# Patient Record
Sex: Female | Born: 1990 | Race: Black or African American | Hispanic: No | Marital: Single | Smoking: Never smoker
Health system: Southern US, Community
[De-identification: ages and names within clinical notes are randomized; demographics above are authoritative.]

## PROBLEM LIST (undated history)

## (undated) DIAGNOSIS — F509 Eating disorder, unspecified: Secondary | ICD-10-CM

## (undated) DIAGNOSIS — T7840XA Allergy, unspecified, initial encounter: Secondary | ICD-10-CM

## (undated) DIAGNOSIS — F32A Depression, unspecified: Secondary | ICD-10-CM

## (undated) DIAGNOSIS — J45909 Unspecified asthma, uncomplicated: Secondary | ICD-10-CM

## (undated) DIAGNOSIS — F329 Major depressive disorder, single episode, unspecified: Secondary | ICD-10-CM

## (undated) DIAGNOSIS — F419 Anxiety disorder, unspecified: Secondary | ICD-10-CM

## (undated) DIAGNOSIS — N946 Dysmenorrhea, unspecified: Secondary | ICD-10-CM

## (undated) HISTORY — DX: Dysmenorrhea, unspecified: N94.6

## (undated) HISTORY — DX: Allergy, unspecified, initial encounter: T78.40XA

## (undated) HISTORY — DX: Eating disorder, unspecified: F50.9

## (undated) HISTORY — PX: TONSILLECTOMY AND ADENOIDECTOMY: SUR1326

## (undated) HISTORY — DX: Anxiety disorder, unspecified: F41.9

## (undated) HISTORY — DX: Major depressive disorder, single episode, unspecified: F32.9

## (undated) HISTORY — DX: Depression, unspecified: F32.A

## (undated) HISTORY — DX: Unspecified asthma, uncomplicated: J45.909

---
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
## 2013-09-25 ENCOUNTER — Encounter: Payer: Self-pay | Admitting: *Deleted

## 2013-09-26 ENCOUNTER — Ambulatory Visit (INDEPENDENT_AMBULATORY_CARE_PROVIDER_SITE_OTHER): Payer: BC Managed Care – PPO | Admitting: Family Medicine

## 2013-09-26 ENCOUNTER — Encounter: Payer: Self-pay | Admitting: Family Medicine

## 2013-09-26 VITALS — BP 118/62 | HR 78 | Temp 98.4°F | Resp 14 | Ht 64.5 in | Wt 160.0 lb

## 2013-09-26 DIAGNOSIS — R5381 Other malaise: Secondary | ICD-10-CM

## 2013-09-26 DIAGNOSIS — F319 Bipolar disorder, unspecified: Secondary | ICD-10-CM

## 2013-09-26 DIAGNOSIS — R5383 Other fatigue: Secondary | ICD-10-CM

## 2013-09-26 DIAGNOSIS — G4700 Insomnia, unspecified: Secondary | ICD-10-CM

## 2013-09-26 LAB — TSH: TSH: 1.656 u[IU]/mL (ref 0.350–4.500)

## 2013-09-26 LAB — COMPREHENSIVE METABOLIC PANEL
ALT: 10 U/L (ref 0–35)
AST: 15 U/L (ref 0–37)
Albumin: 3.5 g/dL (ref 3.5–5.2)
Alkaline Phosphatase: 37 U/L — ABNORMAL LOW (ref 39–117)
BUN: 8 mg/dL (ref 6–23)
CHLORIDE: 103 meq/L (ref 96–112)
CO2: 25 meq/L (ref 19–32)
Calcium: 8.8 mg/dL (ref 8.4–10.5)
Creat: 0.67 mg/dL (ref 0.50–1.10)
Glucose, Bld: 83 mg/dL (ref 70–99)
POTASSIUM: 4.4 meq/L (ref 3.5–5.3)
Sodium: 137 mEq/L (ref 135–145)
Total Bilirubin: 0.4 mg/dL (ref 0.2–1.2)
Total Protein: 6.6 g/dL (ref 6.0–8.3)

## 2013-09-26 LAB — CBC WITH DIFFERENTIAL/PLATELET
BASOS ABS: 0.1 10*3/uL (ref 0.0–0.1)
Basophils Relative: 1 % (ref 0–1)
Eosinophils Absolute: 0.1 10*3/uL (ref 0.0–0.7)
Eosinophils Relative: 2 % (ref 0–5)
HCT: 36.7 % (ref 36.0–46.0)
Hemoglobin: 12.6 g/dL (ref 12.0–15.0)
LYMPHS ABS: 2.4 10*3/uL (ref 0.7–4.0)
Lymphocytes Relative: 32 % (ref 12–46)
MCH: 28.6 pg (ref 26.0–34.0)
MCHC: 34.3 g/dL (ref 30.0–36.0)
MCV: 83.2 fL (ref 78.0–100.0)
MONO ABS: 0.6 10*3/uL (ref 0.1–1.0)
Monocytes Relative: 8 % (ref 3–12)
NEUTROS ABS: 4.2 10*3/uL (ref 1.7–7.7)
Neutrophils Relative %: 57 % (ref 43–77)
PLATELETS: 283 10*3/uL (ref 150–400)
RBC: 4.41 MIL/uL (ref 3.87–5.11)
RDW: 13 % (ref 11.5–15.5)
WBC: 7.4 10*3/uL (ref 4.0–10.5)

## 2013-09-26 MED ORDER — TRAZODONE HCL 50 MG PO TABS: 25.0000 mg | ORAL_TABLET | Freq: Every evening | ORAL | Status: DC | PRN

## 2013-09-26 NOTE — Assessment & Plan Note (Signed)
Trial of trazodone. 

## 2013-09-26 NOTE — Patient Instructions (Signed)
Referral to psychiatry Try the trazodone take 1/2 to 1 tablet 1 hour before bedtime We will call with lab results F/U 6 weeks for recheck
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
## 2013-09-26 NOTE — Progress Notes (Signed)
Patient ID: Cheryl Walter, female   DOB: 09/05/1990, 23 y.o.   MRN: [id number]   Subjective:    Patient ID: Cheryl Carsonakota R Schwenn, female    DOB: 01/07/1991, 23 y.o.   MRN: [id number]  Patient presents for New Patient CPE  patient here to establish care. She's not had a primary care provider. She's here secondary to bipolar disorder. She was diagnosed recently by the health [facility] [place] [facility] [place]. She is history of depression and anxiety she did see a therapist as well as a psychiatrist about a year ago at her school. At that time she was prescribed Zoloft but she never took the medication. Over the past 3 months she has had increased anxiety difficulty concentrating and manic symptoms. She states that she is gone 2-3 weeks without sleeping but a total of 5 or 6 hours within that time period. She also states that she's been more promiscuous and typically in that she now has about 3 different partners which is unusual for her. She's also not completing her task for her finals and this may actually keep her from graduating but she still not motivated to do so. She also gives an example is spinning $500 within the past week and does not know exactly what she spent the money on. On the other hand she's also had very low periods where she feels very depressed and feels like she wants to cry at any time. She's not had any hallucinations or suicidal ideations. She was prescribed Seroquel this week but she only took one dose and it made her feel very zonked out until the next day she also had paresthesias after taking the medication he states that she take the entire day therefore she refuses to take this medication.  Obesity she was being followed by weight loss clinic and was on phentermine as well in the past 4 or 5 months. She has lost a total of 40 pounds but states that she gained 10 pounds of it back. She's been off of the phentermine for the past month and has not noticed any change in  her symptoms been on or off the stimulant. She does note that they try to prescribe her metformin in the past as well after looking at her blood work as her mother has a history of diabetes mellitus but she did not take the medication states that often she is prescribed things but just will take the medications.    Review Of Systems:  GEN- denies fatigue, fever, weight loss,weakness, recent illness HEENT- denies eye drainage, change in vision, nasal discharge, CVS- denies chest pain, palpitations RESP- denies SOB, cough, wheeze ABD- denies N/V, change in stools, abd pain GU- denies dysuria, hematuria, dribbling, incontinence MSK- denies joint pain, muscle aches, injury Neuro- denies headache, dizziness, syncope, seizure activity       Objective:    BP 118/62  Pulse 78  Temp(Src) 98.4 F (36.9 C) (Oral)  Resp 14  Ht 5' 4.5" (1.638 m)  Wt 160 lb (72.576 kg)  BMI 27.05 kg/m2  LMP 09/01/2013 GEN- NAD, alert and oriented x3 HEENT- PERRL, EOMI, non injected sclera, pink conjunctiva, MMM, oropharynx clear Neck- Supple, no thyromegaly CVS- RRR, no murmur RESP-CTAB Psych- Crying during exam, not overly anxious, no hallucinations, well groomed, good eye contact, normal speech, no SI EXT- No edema Pulses- Radial 2+        Assessment & Plan:      Problem List Items Addressed This Visit  Insomnia   Relevant Orders      TSH   Bipolar disorder, unspecified - Primary   Relevant Orders      CBC with Differential      Comprehensive metabolic panel    Other Visit Diagnoses   Other malaise and fatigue        Relevant Orders       TSH       Note: This dictation was prepared with Dragon dictation along with smaller phrase technology. Any transcriptional errors that result from this process are unintentional.

## 2013-10-08 ENCOUNTER — Encounter (HOSPITAL_COMMUNITY): Payer: Self-pay | Admitting: Psychiatry

## 2013-10-08 ENCOUNTER — Ambulatory Visit (INDEPENDENT_AMBULATORY_CARE_PROVIDER_SITE_OTHER): Payer: BC Managed Care – PPO | Admitting: Psychiatry

## 2013-10-08 VITALS — BP 120/80 | Ht 64.0 in | Wt 157.0 lb

## 2013-10-08 DIAGNOSIS — F3160 Bipolar disorder, current episode mixed, unspecified: Secondary | ICD-10-CM

## 2013-10-08 DIAGNOSIS — F3162 Bipolar disorder, current episode mixed, moderate: Secondary | ICD-10-CM

## 2013-10-08 MED ORDER — LAMOTRIGINE 25 MG PO TABS: ORAL_TABLET | ORAL | Status: DC
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
## 2013-10-13 ENCOUNTER — Other Ambulatory Visit: Payer: Self-pay | Admitting: Family Medicine

## 2013-10-13 ENCOUNTER — Ambulatory Visit (INDEPENDENT_AMBULATORY_CARE_PROVIDER_SITE_OTHER): Payer: BC Managed Care – PPO | Admitting: Family Medicine

## 2013-10-13 ENCOUNTER — Encounter: Payer: Self-pay | Admitting: Family Medicine

## 2013-10-13 VITALS — BP 118/66 | HR 72 | Temp 98.1°F | Resp 14 | Ht 63.5 in | Wt 158.0 lb

## 2013-10-13 DIAGNOSIS — G4700 Insomnia, unspecified: Secondary | ICD-10-CM

## 2013-10-13 DIAGNOSIS — Z124 Encounter for screening for malignant neoplasm of cervix: Secondary | ICD-10-CM

## 2013-10-13 DIAGNOSIS — J309 Allergic rhinitis, unspecified: Secondary | ICD-10-CM

## 2013-10-13 DIAGNOSIS — Z113 Encounter for screening for infections with a predominantly sexual mode of transmission: Secondary | ICD-10-CM

## 2013-10-13 DIAGNOSIS — F319 Bipolar disorder, unspecified: Secondary | ICD-10-CM

## 2013-10-13 DIAGNOSIS — Z01419 Encounter for gynecological examination (general) (routine) without abnormal findings: Secondary | ICD-10-CM

## 2013-10-13 LAB — WET PREP FOR TRICH, YEAST, CLUE
Trich, Wet Prep: NONE SEEN
Yeast Wet Prep HPF POC: NONE SEEN

## 2013-10-13 MED ORDER — FLUTICASONE PROPIONATE 50 MCG/ACT NA SUSP: 2.0000 | Freq: Every day | NASAL | Status: DC

## 2013-10-13 NOTE — Patient Instructions (Signed)
Try the trazodone I agree to stop the phentermine We will call with lab results Schedule with a new therapist in [place] F/U as needed

## 2013-10-13 NOTE — Assessment & Plan Note (Signed)
flonase prn 

## 2013-10-13 NOTE — Assessment & Plan Note (Signed)
Immunizations UTD PAP Done STD check

## 2013-10-13 NOTE — Progress Notes (Signed)
Patient ID: Cheryl Carsonakota R Finan, female   DOB: 07/22/1990, 23 y.o.   MRN: [id number]    Subjective:    Patient ID: Cheryl Carsonakota R Guggisberg, female    DOB: 05/04/1991, 23 y.o.   MRN: [id number]  Patient presents for PAP and Chest congestion  patient here for GYN exam. She is due for Pap smear. She states that [facility] but they cannot do gonorrhea Chlamydia and will like to have that done. She has completed a course of Flagyl and she has some Diflucan. She plans to start Diflucan today.  Was seen by psychiatry secondary to bipolar and depression. She was started on Lamictal however she does not take the medications because her mother told her that it could be multiple side effects and not to do so. I'm very confused about this as her mother was the one that was very concerned and adamant that she see psychiatry secondary to her mood. She feels like a lot of stress comes from her mother in general and the fact that she wants to go off some of her life with her friends in a different city from her parents and they do not approve of this. She also did not take the trazodone but continues to have difficulty with her sleep.  She also has had some congestion the past couple months. She has no cough but in the morning times feels like she is cough of drainage from the night. She feels a lot of drainage from her nose. She's not tried an over-the-counter allergy medications. No fever no shortness of breath associated  LMP - 2 weeks ago  Review Of Systems:  GEN- denies fatigue, fever, weight loss,weakness, recent illness HEENT- denies eye drainage, change in vision, +nasal discharge, CVS- denies chest pain, palpitations RESP- denies SOB, cough, wheeze ABD- denies N/V, change in stools, abd pain GU- denies dysuria, hematuria, dribbling, incontinence MSK- denies joint pain, muscle aches, injury Neuro- denies headache, dizziness, syncope, seizure activity        Objective:    BP 118/66  Pulse 72  Temp(Src) 98.1 F (36.7 C) (Oral)  Resp 14  Ht 5' 3.5" (1.613 m)  Wt 158 lb (71.668 kg)  BMI 27.55 kg/m2  LMP 09/29/2013 GEN- NAD, alert and oriented x3 HEENT-PERRL,. EOMI, non icteric, MMM, oropharynx clear, nares clear, TM clear bilat Breast- normal symmetry, no nipple inversion,no nipple drainage, no nodules or lumps felt- piercing through nipples Nodes- no axillary nodes CVS- RRR, no murmur RESP-CTAB GU- normal external genitalia, vaginal mucosa pink and moist, cervix visualized no growth, no blood form os, minimal thin clear discharge, no CMT, no ovarian masses, uterus normal size Psych- crying discussing her family, not anxious appearing, good eye contact, well groomed, no SI        Assessment & Plan:      Problem List Items Addressed This Visit   None    Visit Diagnoses   Screening for malignant neoplasm of the cervix    -  Primary    Relevant Orders       PAP, ThinPrep ASCUS Rflx HPV Rflx Type    Screen for STD (sexually transmitted disease)        Relevant Orders       HIV antibody       GC/chlamydia probe amp, genital       RPR       Hepatitis B surface antigen       WET  PREP FOR TRICH, YEAST, CLUE (Completed)       Note: This dictation was prepared with Dragon dictation along with smaller phrase technology. Any transcriptional errors that result from this process are unintentional.

## 2013-10-13 NOTE — Assessment & Plan Note (Signed)
Patient declines taken medication at this time voice importance her legs following up with a therapist which talking seems to help her. She will hold off on Lamictal at this time. She agrees to therapy. No harm to herself or others

## 2013-10-13 NOTE — Assessment & Plan Note (Signed)
She agreed to try the trazodone. She also gave me permission to speak with her mother who is also a patient of mine to discuss how she is feeling regarding the next set of her life moving away from home

## 2013-10-14 ENCOUNTER — Encounter: Payer: Self-pay | Admitting: *Deleted

## 2013-10-14 LAB — RPR

## 2013-10-14 LAB — PAP THINPREP ASCUS RFLX HPV RFLX TYPE

## 2013-10-14 LAB — HIV ANTIBODY (ROUTINE TESTING W REFLEX): HIV 1&2 Ab, 4th Generation: NONREACTIVE

## 2013-10-14 LAB — GC/CHLAMYDIA PROBE AMP
CT PROBE, AMP APTIMA: NEGATIVE
GC PROBE AMP APTIMA: NEGATIVE

## 2013-10-14 LAB — HEPATITIS B SURFACE ANTIGEN: Hepatitis B Surface Ag: NEGATIVE

## 2013-11-03 ENCOUNTER — Ambulatory Visit (HOSPITAL_COMMUNITY): Payer: Self-pay | Admitting: Psychiatry

## 2013-11-07 ENCOUNTER — Ambulatory Visit: Payer: BC Managed Care – PPO | Admitting: Family Medicine

## 2013-11-14 ENCOUNTER — Encounter: Payer: Self-pay | Admitting: Family Medicine

## 2013-11-14 ENCOUNTER — Ambulatory Visit (INDEPENDENT_AMBULATORY_CARE_PROVIDER_SITE_OTHER): Payer: BC Managed Care – PPO | Admitting: Family Medicine

## 2013-11-14 VITALS — BP 116/64 | HR 82 | Temp 99.0°F | Resp 16 | Ht 63.0 in | Wt 160.0 lb

## 2013-11-14 DIAGNOSIS — L568 Other specified acute skin changes due to ultraviolet radiation: Secondary | ICD-10-CM

## 2013-11-14 DIAGNOSIS — R21 Rash and other nonspecific skin eruption: Secondary | ICD-10-CM

## 2013-11-14 DIAGNOSIS — J45909 Unspecified asthma, uncomplicated: Secondary | ICD-10-CM

## 2013-11-14 DIAGNOSIS — J988 Other specified respiratory disorders: Secondary | ICD-10-CM

## 2013-11-14 MED ORDER — DESOGESTREL-ETHINYL ESTRADIOL 0.15-0.02/0.01 MG (21/5) PO TABS: 1.0000 | ORAL_TABLET | Freq: Every day | ORAL | Status: DC

## 2013-11-14 MED ORDER — PREDNISONE 20 MG PO TABS: 20.0000 mg | ORAL_TABLET | Freq: Every day | ORAL | Status: DC

## 2013-11-14 MED ORDER — ALBUTEROL SULFATE HFA 108 (90 BASE) MCG/ACT IN AERS: 2.0000 | INHALATION_SPRAY | RESPIRATORY_TRACT | Status: DC | PRN

## 2013-11-14 MED ORDER — LEVOCETIRIZINE DIHYDROCHLORIDE 5 MG PO TABS: 5.0000 mg | ORAL_TABLET | Freq: Every evening | ORAL | Status: DC

## 2013-11-14 MED ORDER — AZITHROMYCIN 250 MG PO TABS: ORAL_TABLET | ORAL | Status: DC

## 2013-11-14 MED ORDER — TRIAMCINOLONE ACETONIDE 0.1 % EX CREA: 1.0000 "application " | TOPICAL_CREAM | Freq: Two times a day (BID) | CUTANEOUS | Status: DC

## 2013-11-14 NOTE — Patient Instructions (Signed)
Start antibiotics, prednisone, continue albuterol New allergy pill once a day Steroid cream for your arm  Good Luck! F/U as needed

## 2013-11-14 NOTE — Progress Notes (Signed)
Patient ID: Cheryl Carsonakota R Rancourt, female   DOB: 09/09/1990, 23 y.o.   MRN: [id number]   Subjective:    Patient ID: Cheryl Walter, female    DOB: 08/24/1990, 23 y.o.   MRN: [id number]  Patient presents for 6 week F/U, Cough and heat allergy  patient here with cough with production worsened over the past week. She's had sputum with yellow tinge. She's also had some wheezing this had to use her albuterol inhaler more than normal this week. She denies any shortness of breath. She is moving to leave the unit in 2 weeks.  Hives she has noticed that when she's out in the sun she breaks out in hives when she cools off they go away. She's had this for a few years she was given an allergy medicine in the past but does not remember the name and not sure if it really helped. She also notices a small bumps on her right elbow this week which has some mild itching.  No she's not taking the trazodone. She never received the Flonase. She is using her birth control as prescribed    Review Of Systems:  GEN- denies fatigue, fever, weight loss,weakness, recent illness HEENT- denies eye drainage, change in vision, nasal discharge, CVS- denies chest pain, palpitations RESP- denies SOB, +cough, +wheeze ABD- denies N/V, change in stools, abd pain GU- denies dysuria, hematuria, dribbling, incontinence MSK- denies joint pain, muscle aches, injury Neuro- denies headache, dizziness, syncope, seizure activity       Objective:    BP 116/64  Pulse 82  Temp(Src) 99 F (37.2 C) (Oral)  Resp 16  Ht 5\' 3"  (1.6 m)  Wt 160 lb (72.576 kg)  BMI 28.35 kg/m2 GEN- NAD, alert and oriented x3 HEENT- PERRL, EOMI, non injected sclera, pink conjunctiva, MMM, oropharynx clear  TM clear bilat no effusion, no maxillary sinus tenderness, nares clear Neck- Supple, no LAD CVS- RRR, no murmur RESP-CTAB, no wheeze, normal WOB Skin- small eczematous rash on right elbow, no erythema, no excoriations EXT- No edema Pulses- Radial  2+         Assessment & Plan:      Problem List Items Addressed This Visit   None    Visit Diagnoses   Respiratory infection    -  Primary    treat with zpak, steroids, with underlying asthma and worsening, allergy medication also given    Relevant Medications       azithromycin (ZITHROMAX) tablet    Mild asthma        continue albuterol prn    Relevant Medications       predniSONE (DELTASONE) tablet       albuterol (PROVENTIL HFA;VENTOLIN HFA) 108 (90 BASE) MCG/ACT inhaler    Rash and nonspecific skin eruption        small eczematous patch on her right elbow we'll use triamcinolone cream for this. I think she also has photodermatitis    Photodermatitis due to sun        Discussed avoidance of sun directly and keeping herself shaded. I've also given her xyzak to see if this will help with the hives and itching when it does po    Relevant Medications       predniSONE (DELTASONE) tablet       TRIAMCINOLONE ACETONIDE 0.1% EX CREA       levocetirizine (XYZAL) tablet 5 mg       Note: This dictation was prepared with Dragon dictation along with smaller phrase technology.  Any transcriptional errors that result from this process are unintentional.

## 2013-11-18 ENCOUNTER — Telehealth: Payer: Self-pay | Admitting: *Deleted

## 2013-11-18 NOTE — Telephone Encounter (Signed)
See if there is any improvement with the steroids or the inhaler, if not better - send for CXR first, go to ER if severe Nebulizer can be sent- diagnosis asthma Albuterol 2.5mg  neb solution every 4 hours prn

## 2013-11-18 NOTE — Telephone Encounter (Signed)
Call placed to patient.   States that she is not feeling any better and would like MD to advise.   Reports that she would like a nebulizer.

## 2013-11-18 NOTE — Telephone Encounter (Signed)
Message copied by Phillips OdorSIX, CHRISTINA H on Tue Nov 18, 2013  4:45 PM ------      Message from: Malvin JohnsBULLINS, SUSAN S      Created: Tue Nov 18, 2013  4:13 PM       [phone]      PATIENT NEEDS A NEBULIZER CALLED IN IF POSSIBLE SHE IS STILL NOT FEELING BETTER [facility] - [place], [place] - [address] ------

## 2013-11-19 NOTE — Telephone Encounter (Signed)
Call placed to patient to make aware. LMTRC. 

## 2013-11-21 NOTE — Telephone Encounter (Signed)
Attempted to return call to patient multiple times with no answer and no return call.

## 2014-03-24 ENCOUNTER — Other Ambulatory Visit: Payer: Self-pay | Admitting: Family Medicine

## 2014-03-24 MED ORDER — ALBUTEROL SULFATE (2.5 MG/3ML) 0.083% IN NEBU: 2.5000 mg | INHALATION_SOLUTION | Freq: Four times a day (QID) | RESPIRATORY_TRACT | Status: DC | PRN

## 2014-04-21 ENCOUNTER — Telehealth: Payer: Self-pay | Admitting: Family Medicine

## 2014-04-21 MED ORDER — DESOGESTREL-ETHINYL ESTRADIOL 0.15-0.02/0.01 MG (21/5) PO TABS: 1.0000 | ORAL_TABLET | Freq: Every day | ORAL | Status: DC

## 2014-04-21 NOTE — Telephone Encounter (Signed)
Prescription sent to pharmacy.

## 2014-04-21 NOTE — Telephone Encounter (Signed)
[phone]  PT is needing a refill on her Birth Control Needs to be called in to this  [facility] [address] [place]   [phone]

## 2014-04-22 ENCOUNTER — Ambulatory Visit: Payer: BC Managed Care – PPO | Admitting: Family Medicine

## 2014-05-07 ENCOUNTER — Telehealth: Payer: Self-pay | Admitting: Family Medicine

## 2014-05-07 NOTE — Telephone Encounter (Signed)
Patients mom tracy is calling to see if there is anything cheaper we can call in for her birth control, she is in [place] and is going to make an appointment when she get in town for [employer] [place]  [phone]    [facility] [place]

## 2014-05-07 NOTE — Telephone Encounter (Signed)
MD please advise

## 2014-05-08 NOTE — Telephone Encounter (Signed)
Call placed to patient mother. LMTRC.  

## 2014-05-08 NOTE — Telephone Encounter (Signed)
Switch to sprintec 1 tab daily, on [facility] $4 list - R 6

## 2014-05-08 NOTE — Telephone Encounter (Signed)
Call placed to patient. LMTRC.  

## 2014-05-11 MED ORDER — NORGESTIMATE-ETH ESTRADIOL 0.25-35 MG-MCG PO TABS: 1.0000 | ORAL_TABLET | Freq: Every day | ORAL | Status: DC

## 2014-05-11 NOTE — Telephone Encounter (Signed)
Call placed to patient and patient made aware.   Prescription sent to pharmacy.  

## 2014-05-26 ENCOUNTER — Encounter: Payer: Self-pay | Admitting: Family Medicine

## 2014-05-26 ENCOUNTER — Other Ambulatory Visit: Payer: Self-pay | Admitting: Family Medicine

## 2014-05-26 ENCOUNTER — Ambulatory Visit (INDEPENDENT_AMBULATORY_CARE_PROVIDER_SITE_OTHER): Payer: 59 | Admitting: Family Medicine

## 2014-05-26 VITALS — BP 118/70 | HR 82 | Temp 99.1°F | Resp 16 | Ht 63.0 in | Wt 178.0 lb

## 2014-05-26 DIAGNOSIS — R591 Generalized enlarged lymph nodes: Secondary | ICD-10-CM

## 2014-05-26 DIAGNOSIS — Z113 Encounter for screening for infections with a predominantly sexual mode of transmission: Secondary | ICD-10-CM

## 2014-05-26 DIAGNOSIS — J40 Bronchitis, not specified as acute or chronic: Secondary | ICD-10-CM

## 2014-05-26 DIAGNOSIS — F411 Generalized anxiety disorder: Secondary | ICD-10-CM

## 2014-05-26 DIAGNOSIS — Z23 Encounter for immunization: Secondary | ICD-10-CM

## 2014-05-26 LAB — WET PREP FOR TRICH, YEAST, CLUE
Trich, Wet Prep: NONE SEEN
Yeast Wet Prep HPF POC: NONE SEEN

## 2014-05-26 MED ORDER — AZITHROMYCIN 250 MG PO TABS: ORAL_TABLET | ORAL | Status: DC

## 2014-05-26 MED ORDER — BUSPIRONE HCL 10 MG PO TABS: 10.0000 mg | ORAL_TABLET | Freq: Two times a day (BID) | ORAL | Status: DC

## 2014-05-26 NOTE — Patient Instructions (Addendum)
Start buspar twice a day for anxiety Take antibiotics COntinue mucinex We will call with results F/U 3 months  call in 4 weeks to see how meds are doing,

## 2014-05-26 NOTE — Progress Notes (Signed)
Patient ID: Cheryl Walter, female   DOB: 09/11/1990, 23 y.o.   MRN: [id number]   Subjective:    Patient ID: Cheryl Carsonakota R Penado, female    DOB: 07/03/1990, 23 y.o.   MRN: [id number]  Patient presents for F/U; Congestion; R Ear Knot; and STD check  patient here with ongoing congestion for about a month. She's had some cough with mild production sinus drainage. She's not had any fever no current sore throat. She also noticed a knot behind her right ear which is a little tender. She has not used her nebs or inhaer recently.  She requests an STD check states that she is not sexually active currently. Last menstrual period was 2 weeks ago.  Anxiety she has history of anxiety and bipolar depression. She's never wanted to take her medications in the past we see previous records regarding this. 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 She feels that she needs something to help calm down the anxiety part she does not feel depressed states that her mood overall is good.    Review Of Systems:  GEN- denies fatigue, fever, weight loss,weakness, recent illness HEENT- denies eye drainage, change in vision, +nasal discharge, CVS- denies chest pain, palpitations RESP- denies SOB, +cough, wheeze ABD- denies N/V, change in stools, abd pain GU- denies dysuria, hematuria, dribbling, incontinence MSK- denies joint pain, muscle aches, injury Neuro- denies headache, dizziness, syncope, seizure activity       Objective:    BP 118/70 mmHg  Pulse 82  Temp(Src) 99.1 F (37.3 C) (Oral)  Resp 16  Ht 5\' 3"  (1.6 m)  Wt 178 lb (80.74 kg)  BMI 31.54 kg/m2 GEN- NAD, alert and oriented x3 HEENT- PERRL, EOMI, non injected sclera, pink conjunctiva, MMM, oropharynx clear Neck- Supple, no thyromegaly, RIght post auiricalar node, shotty  submandibular nodes CVS- RRR, no murmur RESP-Congestion bilat, no wheeze, decreased at bases, normal WOB GU- normal external genitalia, vaginal mucosa pink and moist, cervix visualized no growth, no blood form os, + discharge, no CMT, no ovarian masses, uterus normal size EXT- No edema Pulses- Radial 2+        Assessment & Plan:      Problem List Items Addressed This Visit      Unprioritized   GAD (generalized anxiety disorder) - Primary    Trial of buspar 10mg  BID, see how she does, f/u via phone will be going back to [place] soon. We will see her back in the office in a couple months unless she establishes with a primary care provider there. Discussed side effects of the medication     Other Visit Diagnoses    Bronchitis        Treat with zpak with concurrent node present for a few weeks, if this does not go down needs imaging, mucinex DM    Screen for STD (sexually transmitted disease)        STD screen done, including HIV/RPR    Relevant Orders       WET PREP FOR TRICH, YEAST, CLUE (Completed)       GC/chlamydia probe amp, genital       HIV antibody (Completed)       RPR (Completed)    Need for prophylactic vaccination and inoculation against influenza  Relevant Orders       Flu Vaccine QUAD 36+ mos PF IM (Fluarix Quad PF) (Completed)    Lymphadenopathy           Note: This dictation was prepared with Dragon dictation along with smaller phrase technology. Any transcriptional errors that result from this process are unintentional.

## 2014-05-27 LAB — GC/CHLAMYDIA PROBE AMP
CT Probe RNA: NEGATIVE
GC Probe RNA: NEGATIVE

## 2014-05-27 LAB — RPR

## 2014-05-27 LAB — HIV ANTIBODY (ROUTINE TESTING W REFLEX): HIV: NONREACTIVE

## 2014-05-27 NOTE — Assessment & Plan Note (Signed)
Trial of buspar 10mg  BID, see how she does, f/u via phone will be going back to [place] soon. We will see her back in the office in a couple months unless she establishes with a primary care provider there. Discussed side effects of the medication

## 2014-05-28 ENCOUNTER — Other Ambulatory Visit: Payer: Self-pay | Admitting: *Deleted

## 2014-05-28 MED ORDER — METRONIDAZOLE 500 MG PO TABS: 500.0000 mg | ORAL_TABLET | Freq: Two times a day (BID) | ORAL | Status: DC

## 2014-06-23 ENCOUNTER — Encounter: Payer: Self-pay | Admitting: Family Medicine

## 2014-07-03 ENCOUNTER — Telehealth: Payer: Self-pay | Admitting: Family Medicine

## 2014-07-03 NOTE — Telephone Encounter (Signed)
-----   Message from Durwin Norahristina H Six, LPN sent at 8/2/95622/09/2014  3:06 PM EST ----- Regarding: RE: F/U buspar Call placed to patient.   States that she feels that Buspar is effective and denies any questions or concerns.   ----- Message -----    From: Salley ScarletKawanta F Mayo, MD    Sent: 07/03/2014   1:47 PM      To: Durwin Norahristina H Six, LPN Subject: FW: F/U buspar                                 Call pt, see how she is doing with the buspar for her anxiety ----- Message -----    From: Salley ScarletKawanta F [place], MD    Sent: 06/29/2014      To: Salley ScarletKawanta F [place], MD Subject: F/U buspar

## 2014-08-03 ENCOUNTER — Ambulatory Visit (INDEPENDENT_AMBULATORY_CARE_PROVIDER_SITE_OTHER): Payer: 59 | Admitting: Family Medicine

## 2014-08-03 ENCOUNTER — Encounter: Payer: Self-pay | Admitting: Family Medicine

## 2014-08-03 VITALS — BP 118/60 | HR 86 | Temp 99.1°F | Resp 14 | Ht 63.0 in | Wt 177.0 lb

## 2014-08-03 DIAGNOSIS — N898 Other specified noninflammatory disorders of vagina: Secondary | ICD-10-CM | POA: Diagnosis not present

## 2014-08-03 DIAGNOSIS — L700 Acne vulgaris: Secondary | ICD-10-CM

## 2014-08-03 LAB — WET PREP FOR TRICH, YEAST, CLUE
Trich, Wet Prep: NONE SEEN
Yeast Wet Prep HPF POC: NONE SEEN

## 2014-08-03 MED ORDER — METRONIDAZOLE 500 MG PO TABS: 500.0000 mg | ORAL_TABLET | Freq: Two times a day (BID) | ORAL | Status: DC

## 2014-08-03 MED ORDER — FLUCONAZOLE 150 MG PO TABS: 150.0000 mg | ORAL_TABLET | Freq: Once | ORAL | Status: DC

## 2014-08-03 MED ORDER — CLINDAMYCIN PHOS-BENZOYL PEROX 1-5 % EX GEL: Freq: Two times a day (BID) | CUTANEOUS | Status: DC

## 2014-08-03 NOTE — Patient Instructions (Addendum)
Use a mild cleanser Apply the benzaclin as directed to skin Antibiotics as prescribed F/U as needed

## 2014-08-03 NOTE — Progress Notes (Signed)
Patient ID: Cheryl Walter Soy, female   DOB: 03/12/1991, 24 y.o.   MRN: [id number]   Subjective:    Patient ID: Cheryl Walter Isenberg, female    DOB: 11/17/1990, 24 y.o.   MRN: [id number]  Patient presents for Vaginal Discharge and Irritation   patient here with vaginal discharge for the past 3 weeks. She states that she use some Monistat over-the-counter because she had a lot of itching but this did not help. She's not been sexually active in the past few months. Her last mensess Was 3 weeks ago. She denies any abdominal pain no dysuria no fever. She also complains of acne worse on her back and chest she also has on her face. She uses an over-the-counter acne wash call purpose. She's never used any prescription medications    Review Of Systems:  GEN- denies fatigue, fever, weight loss,weakness, recent illness HEENT- denies eye drainage, change in vision, nasal discharge, CVS- denies chest pain, palpitations RESP- denies SOB, cough, wheeze ABD- denies N/V, change in stools, abd pain GU- denies dysuria, hematuria, dribbling, incontinence MSK- denies joint pain, muscle aches, injury Neuro- denies headache, dizziness, syncope, seizure activity       Objective:    BP 118/60 mmHg  Pulse 86  Temp(Src) 99.1 F (37.3 C) (Oral)  Resp 14  Ht 5\' 3"  (1.6 m)  Wt 177 lb (80.287 kg)  BMI 31.36 kg/m2  LMP 07/13/2014 (Approximate) GEN- NAD, alert and oriented x3 Skin- mild acne on face- chins, forehead, acne lesion across back- small with few pustular lesions, mild on center of chest, no nodular/cystic acne seen GU- normal external genitalia, vaginal mucosa pink and moist, cervix visualized no growth, no blood form os, + discharge, no CMT, no ovarian masses, uterus normal size         Assessment & Plan:      Problem List Items Addressed This Visit    None    Visit Diagnoses    Vaginal discharge    -  Primary    Large amount of bactera, and WBC, no yeast seen, will treat with flagyl  symptomatcally, will give diflucan at end of antibiotics    Relevant Orders    WET PREP FOR TRICH, YEAST, CLUE (Completed)    Acne vulgaris        Trial of topical benzaclin twice a day , wash with mild cleanser first    Relevant Medications    clindamycin-benzoyl peroxide (BENZACLIN) gel 1-5%       Note: This dictation was prepared with Dragon dictation along with smaller phrase technology. Any transcriptional errors that result from this process are unintentional.

## 2014-08-04 ENCOUNTER — Telehealth: Payer: Self-pay | Admitting: *Deleted

## 2014-08-04 MED ORDER — CLINDAMYCIN PHOS-BENZOYL PEROX 1-5 % EX GEL: Freq: Two times a day (BID) | CUTANEOUS | Status: DC

## 2014-08-04 NOTE — Telephone Encounter (Signed)
MD reviewed formulary and new orders obtained to send in Clindamycin with Benzol Peroxide gel 1.2-5%.   Prescription sent to pharmacy.

## 2014-08-04 NOTE — Telephone Encounter (Signed)
Did they give you alternatives, not sure why this isnt covered

## 2014-08-04 NOTE — Telephone Encounter (Signed)
Received request from pharmacy for PA on Clindamycin Benzol Peroxide Gel.   MD please advise.

## 2014-08-05 ENCOUNTER — Telehealth: Payer: Self-pay | Admitting: Family Medicine

## 2014-08-05 ENCOUNTER — Encounter: Payer: Self-pay | Admitting: Family Medicine

## 2014-08-05 NOTE — Telephone Encounter (Signed)
-----   Message from Samuella CotaSabrina T Simmons, [place] sent at 08/04/2014  9:59 AM EST ----- Regarding: please call Contact: [phone] Pt called stating she is needing you to return her call in reference to a comment that was made yesterday. States she does not want to talk to the nurse.  Please call [phone]

## 2014-08-05 NOTE — Telephone Encounter (Signed)
I spoke with patient. She was concerned about the comment  regarding her weight to make sure that she was eating healthy and exercising and she had gained weight. She states that she knew she had gained weight but did not talk about it at the time. she's had problems with her weight since she was 24 years old she has history of anorexia and bulimia she was never treated for these. She's been to weight loss clinics and was on phentermine in the past but this actually caused changes with her mood therefore she came off of it. We discussed healthy eating and that may be counting calories and such things would not be beneficial to her as it tends to drive her Lopid overboard. Since she has been away in [place] she does not like where she is living now wants to return to [place] she states that she eats out a lot and eat whatever she wants to emotionally. She asked about what kind of exercise she can do what types of foods she can eat B discussed protein veggies and fruits at least 3 meals a day or 5 small meals we also discussed that she can work out 30 minutes a day for 5 days a week. We discussed not skipping any meals to make sure that she does eat breakfast. She feels that immensely she can handle the spring she had difficulties prior above when she was younger but that she can work on her diet and will call me if there are any concerns. I will just check in with her in about 2 weeks to see how she is doing

## 2014-08-10 ENCOUNTER — Encounter: Payer: Self-pay | Admitting: Family Medicine

## 2014-09-01 ENCOUNTER — Telehealth: Payer: Self-pay | Admitting: *Deleted

## 2014-09-01 NOTE — Telephone Encounter (Signed)
-----   Message from Salley ScarletKawanta F Connell, MD sent at 08/28/2014  1:47 PM EDT ----- Regarding: FW: F/U how pt is doing with meals Call pt see how she is doing, is she eating okay, any problems with her medications?  ----- Message -----    From: Salley ScarletKawanta F [place], MD    Sent: 08/19/2014      To: Salley ScarletKawanta F [place], MD Subject: F/U how pt is doing with meals

## 2014-09-01 NOTE — Telephone Encounter (Signed)
Multiple calls placed to patient to follow up.   No answer and no return call noted.

## 2015-08-24 ENCOUNTER — Other Ambulatory Visit: Payer: Self-pay | Admitting: Family Medicine

## 2015-08-24 NOTE — Telephone Encounter (Signed)
Refill appropriate and filled per protocol. 

## 2016-12-12 DIAGNOSIS — R768 Other specified abnormal immunological findings in serum: Secondary | ICD-10-CM | POA: Insufficient documentation

## 2018-05-11 DIAGNOSIS — F4322 Adjustment disorder with anxiety: Secondary | ICD-10-CM | POA: Diagnosis not present

## 2018-07-24 ENCOUNTER — Other Ambulatory Visit (HOSPITAL_COMMUNITY)
Admission: RE | Admit: 2018-07-24 | Discharge: 2018-07-24 | Disposition: A | Discharge status: Home / Self Care | Payer: BLUE CROSS/BLUE SHIELD | Source: Ambulatory Visit | Attending: Obstetrics and Gynecology | Admitting: Obstetrics and Gynecology

## 2018-07-24 ENCOUNTER — Encounter: Payer: Self-pay | Admitting: Obstetrics and Gynecology

## 2018-07-24 ENCOUNTER — Ambulatory Visit: Payer: BLUE CROSS/BLUE SHIELD | Admitting: Obstetrics and Gynecology

## 2018-07-24 ENCOUNTER — Other Ambulatory Visit: Payer: Self-pay

## 2018-07-24 VITALS — BP 120/78 | HR 64 | Ht 63.0 in | Wt 174.0 lb

## 2018-07-24 DIAGNOSIS — N6452 Nipple discharge: Secondary | ICD-10-CM | POA: Diagnosis not present

## 2018-07-24 DIAGNOSIS — Z113 Encounter for screening for infections with a predominantly sexual mode of transmission: Secondary | ICD-10-CM | POA: Insufficient documentation

## 2018-07-24 DIAGNOSIS — R5383 Other fatigue: Secondary | ICD-10-CM

## 2018-07-24 DIAGNOSIS — Z833 Family history of diabetes mellitus: Secondary | ICD-10-CM

## 2018-07-24 DIAGNOSIS — N946 Dysmenorrhea, unspecified: Secondary | ICD-10-CM

## 2018-07-24 DIAGNOSIS — Z01419 Encounter for gynecological examination (general) (routine) without abnormal findings: Secondary | ICD-10-CM

## 2018-07-24 DIAGNOSIS — Z7189 Other specified counseling: Secondary | ICD-10-CM

## 2018-07-24 DIAGNOSIS — Z124 Encounter for screening for malignant neoplasm of cervix: Secondary | ICD-10-CM | POA: Diagnosis not present

## 2018-07-24 DIAGNOSIS — Z3009 Encounter for other general counseling and advice on contraception: Secondary | ICD-10-CM

## 2018-07-24 DIAGNOSIS — Z0000 Encounter for general adult medical examination without abnormal findings: Secondary | ICD-10-CM | POA: Diagnosis not present

## 2018-07-24 DIAGNOSIS — Z23 Encounter for immunization: Secondary | ICD-10-CM | POA: Diagnosis not present

## 2018-07-24 DIAGNOSIS — Z7185 Encounter for immunization safety counseling: Secondary | ICD-10-CM

## 2018-07-24 MED ORDER — NORETHIN ACE-ETH ESTRAD-FE 1-20 MG-MCG PO TABS: 1.0000 | ORAL_TABLET | Freq: Every day | ORAL | 0 refills | Status: DC

## 2018-07-24 NOTE — Progress Notes (Signed)
28 y.o. G0P0000 Single Black or African American Not Hispanic or Latino female here for annual exam and to discuss spotting that is occurring in between her cycles.  This has occurred 3 times in the last 3 months. Reports this has never occurred before.    Period Cycle (Days): 28 Period Duration (Days): 4-5 days  Period Pattern: Regular Menstrual Flow: Moderate Menstrual Control: Tampon Menstrual Control Change Freq (Hours): changes tampon every 4-6 hours Dysmenorrhea: (!) Severe Dysmenorrhea Symptoms: Cramping  Cramps have gotten much worse in the last 6 months. Cramps start 2-3 days prior to her cycle and last for 1-2 days of her cycle. She has similar cramping midcycle. She is able to function, but it's difficult. Some nausea, no emesis. Advil helps some.  Sexually active, same partner x 9 months. Using condoms most of the time. No dyspareunia.  She was on OCP's for 11 years, went off in 2018.   Patient's last menstrual period was 07/06/2018 (exact date).          Sexually active: Yes.    The current method of family planning is condoms most of the time.    Exercising: No.  The patient does not participate in regular exercise at present. Smoker:  no  Health Maintenance: Pap:  2018 WNL per patient History of abnormal Pap:  no TDaP:  UTD Gardasil: completed 1    reports that she has never smoked. She has never used smokeless tobacco. She reports previous alcohol use. She reports that she does not use drugs. Just occasional ETOH. She works part time in [employer]. 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  Past Medical History:  Diagnosis Date  . Allergy    seasonal  . Anxiety   . Asthma   . Depression   . Dysmenorrhea   . Eating disorder     Past Surgical History:  Procedure Laterality Date  . TONSILLECTOMY AND ADENOIDECTOMY      Current Outpatient Medications  Medication Sig Dispense Refill  . albuterol (PROVENTIL HFA;VENTOLIN HFA)  108 (90 BASE) MCG/ACT inhaler Inhale 2 puffs into the lungs every 4 (four) hours as needed for wheezing or shortness of breath. 1 each 6  . albuterol (PROVENTIL) (2.5 MG/3ML) 0.083% nebulizer solution USE 1 VIAL VIA NEBULIZER EVERY 6 HOURS AS NEEDED FOR WHEEZING OR SHORTNESS OF BREATH 150 mL 0   No current facility-administered medications for this visit.     Family History  Problem Relation Age of Onset  . Vision loss Mother   . Miscarriages / [place] Mother   . Kidney disease Mother   . Diabetes Mother   . COPD Mother   . Bipolar disorder Mother   . Vision loss Father   . Alcohol abuse Father   . Depression Father   . Alcohol abuse Sister   . Vision loss Brother   . Stroke Maternal Grandmother   . Hypertension Maternal Grandmother   . COPD Maternal Grandmother   . Cancer Maternal Grandmother   . Alcohol abuse Maternal Grandmother   . Bipolar disorder Maternal Grandmother   . Hypertension Maternal Grandfather   . Cancer Maternal Grandfather   . Heart disease Paternal Grandmother   . Heart disease Paternal Grandfather     Review of Systems  Constitutional:       Left nipple itching Bilateral nipple discharge  HENT: Negative.   Eyes: Negative.   Respiratory: Negative.   Cardiovascular: Negative.   Gastrointestinal: Positive for abdominal  distention.  Endocrine: Negative.   Genitourinary: Positive for menstrual problem and vaginal bleeding.  Musculoskeletal: Negative.   Skin: Negative.   Allergic/Immunologic: Negative.   Neurological: Negative.   Hematological: Negative.   Psychiatric/Behavioral: Negative.   She c/o 3 month h/o a clear bilateral nipple d/c. Small amounts. Her nipples are intermittently very itchy, not currently.  She does have dry skin and fatigue, constipation  Exam:   BP 120/78 (BP Location: Right Arm, Patient Position: Sitting, Cuff Size: Normal)   Pulse 64   Ht 5\' 3"  (1.6 m)   Wt 174 lb (78.9 kg)   LMP 07/06/2018 (Exact Date)   BMI 30.82  kg/m   Weight change: @WEIGHTCHANGE @ Height:   Height: 5\' 3"  (160 cm)  Ht Readings from Last 3 Encounters:  07/24/18 5\' 3"  (1.6 m)  08/03/14 5\' 3"  (1.6 m)  05/26/14 5\' 3"  (1.6 m)    General appearance: alert, cooperative and appears stated age Head: Normocephalic, without obvious abnormality, atraumatic Neck: no adenopathy, supple, symmetrical, trachea midline and thyroid normal to inspection and palpation Lungs: clear to auscultation bilaterally Cardiovascular: regular rate and rhythm Breasts: normal appearance, no masses or tenderness, she was able to express a drop of ?clear nipple d/c from the right nipple, none from the left Abdomen: soft, non-tender; non distended,  no masses,  no organomegaly Extremities: extremities normal, atraumatic, no cyanosis or edema Skin: Skin color, texture, turgor normal. No rashes or lesions Lymph nodes: Cervical, supraclavicular, and axillary nodes normal. No abnormal inguinal nodes palpated Neurologic: Grossly normal   Pelvic: External genitalia:  no lesions              Urethra:  normal appearing urethra with no masses, tenderness or lesions              Bartholins and Skenes: normal                 Vagina: normal appearing vagina with normal color and discharge, no lesions              Cervix: no lesions               Bimanual Exam:  Uterus:  normal size, contour, position, consistency, mobility, non-tender              Adnexa: no masses, mildly tender in the left adnexa               Rectovaginal: Confirms               Anus:  normal sphincter tone, no lesions  Chaperone was present for exam.  A:  Well Woman with normal exam  Nipple d/c  Thyroid c/o  FH of DM  Severe dysmenorrhea  Contraception  Intermenstrual spotting, midcycle, reassured that this is normal.    P:   Pap with reflex hpv  Screening STD  Screening labs, TSH, Prolactin, HgbA1C  Start OCP's, no contraindications, risks reviewed, information given  Pill check in 3  months  Discussed breast self exam  Discussed calcium and vit D intake

## 2018-07-24 NOTE — Patient Instructions (Signed)
EXERCISE AND DIET:  We recommended that you start or continue a regular exercise program for good health. Regular exercise means any activity that makes your heart beat faster and makes you sweat.  We recommend exercising at least 30 minutes per day at least 3 days a week, preferably 4 or 5.  We also recommend a diet low in fat and sugar.  Inactivity, poor dietary choices and obesity can cause diabetes, heart attack, stroke, and kidney damage, among others.   ° °ALCOHOL AND SMOKING:  Women should limit their alcohol intake to no more than 7 drinks/beers/glasses of wine (combined, not each!) per week. Moderation of alcohol intake to this level decreases your risk of breast cancer and liver damage. And of course, no recreational drugs are part of a healthy lifestyle.  And absolutely no smoking or even second hand smoke. Most people know smoking can cause heart and lung diseases, but did you know it also contributes to weakening of your bones? Aging of your skin?  Yellowing of your teeth and nails? ° °CALCIUM AND VITAMIN D:  Adequate intake of calcium and Vitamin D are recommended.  The recommendations for exact amounts of these supplements seem to change often, but generally speaking 1,000 mg of calcium (between diet and supplement) and 800 units of Vitamin D per day seems prudent. Certain women may benefit from higher intake of Vitamin D.  If you are among these women, your doctor will have told you during your visit.   ° °PAP SMEARS:  Pap smears, to check for cervical cancer or precancers,  have traditionally been done yearly, although recent scientific advances have shown that most women can have pap smears less often.  However, every woman still should have a physical exam from her gynecologist every year. It will include a breast check, inspection of the vulva and vagina to check for abnormal growths or skin changes, a visual exam of the cervix, and then an exam to evaluate the size and shape of the uterus and  ovaries.  And after 29 years of age, a rectal exam is indicated to check for rectal cancers. We will also provide age appropriate advice regarding health maintenance, like when you should have certain vaccines, screening for sexually transmitted diseases, bone density testing, colonoscopy, mammograms, etc.  ° °MAMMOGRAMS:  All women over 40 years old should have a yearly mammogram. Many facilities now offer a "3D" mammogram, which may cost around $50 extra out of pocket. If possible,  we recommend you accept the option to have the 3D mammogram performed.  It both reduces the number of women who will be called back for extra views which then turn out to be normal, and it is better than the routine mammogram at detecting truly abnormal areas.   ° °COLON CANCER SCREENING: Now recommend starting at age 45. At this time colonoscopy is not covered for routine screening until 50. There are take home tests that can be done between 45-49.  ° °COLONOSCOPY:  Colonoscopy to screen for colon cancer is recommended for all women at age 50.  We know, you hate the idea of the prep.  We agree, BUT, having colon cancer and not knowing it is worse!!  Colon cancer so often starts as a polyp that can be seen and removed at colonscopy, which can quite literally save your life!  And if your first colonoscopy is normal and you have no family history of colon cancer, most women don't have to have it again for   10 years.  Once every ten years, you can do something that may end up saving your life, right?  We will be happy to help you get it scheduled when you are ready.  Be sure to check your insurance coverage so you understand how much it will cost.  It may be covered as a preventative service at no cost, but you should check your particular policy.   ° ° ° °Breast Self-Awareness °Breast self-awareness means being familiar with how your breasts look and feel. It involves checking your breasts regularly and reporting any changes to your  health care provider. °Practicing breast self-awareness is important. A change in your breasts can be a sign of a serious medical problem. Being familiar with how your breasts look and feel allows you to find any problems early, when treatment is more likely to be successful. All women should practice breast self-awareness, including women who have had breast implants. °How to do a breast self-exam °One way to learn what is normal for your breasts and whether your breasts are changing is to do a breast self-exam. To do a breast self-exam: °Look for Changes ° °1. Remove all the clothing above your waist. °2. Stand in front of a mirror in a room with good lighting. °3. Put your hands on your hips. °4. Push your hands firmly downward. °5. Compare your breasts in the mirror. Look for differences between them (asymmetry), such as: °? Differences in shape. °? Differences in size. °? Puckers, dips, and bumps in one breast and not the other. °6. Look at each breast for changes in your skin, such as: °? Redness. °? Scaly areas. °7. Look for changes in your nipples, such as: °? Discharge. °? Bleeding. °? Dimpling. °? Redness. °? A change in position. °Feel for Changes °Carefully feel your breasts for lumps and changes. It is best to do this while lying on your back on the floor and again while sitting or standing in the shower or tub with soapy water on your skin. Feel each breast in the following way: °· Place the arm on the side of the breast you are examining above your head. °· Feel your breast with the other hand. °· Start in the nipple area and make ¾ inch (2 cm) overlapping circles to feel your breast. Use the pads of your three middle fingers to do this. Apply light pressure, then medium pressure, then firm pressure. The light pressure will allow you to feel the tissue closest to the skin. The medium pressure will allow you to feel the tissue that is a little deeper. The firm pressure will allow you to feel the tissue  close to the ribs. °· Continue the overlapping circles, moving downward over the breast until you feel your ribs below your breast. °· Move one finger-width toward the center of the body. Continue to use the ¾ inch (2 cm) overlapping circles to feel your breast as you move slowly up toward your collarbone. °· Continue the up and down exam using all three pressures until you reach your armpit. ° °Write Down What You Find ° °Write down what is normal for each breast and any changes that you find. Keep a written record with breast changes or normal findings for each breast. By writing this information down, you do not need to depend only on memory for size, tenderness, or location. Write down where you are in your menstrual cycle, if you are still menstruating. °If you are having trouble noticing differences   in your breasts, do not get discouraged. With time you will become more familiar with the variations in your breasts and more comfortable with the exam. °How often should I examine my breasts? °Examine your breasts every month. If you are breastfeeding, the best time to examine your breasts is after a feeding or after using a breast pump. If you menstruate, the best time to examine your breasts is 5-7 days after your period is over. During your period, your breasts are lumpier, and it may be more difficult to notice changes. °When should I see my health care provider? °See your health care provider if you notice: °· A change in shape or size of your breasts or nipples. °· A change in the skin of your breast or nipples, such as a reddened or scaly area. °· Unusual discharge from your nipples. °· A lump or thick area that was not there before. °· Pain in your breasts. °· Anything that concerns you. ° ° °Oral Contraception Information °Oral contraceptive pills (OCPs) are medicines taken to prevent pregnancy. OCPs are taken by mouth, and they work by: °· Preventing the ovaries from releasing eggs. °· Thickening mucus  in the lower part of the uterus (cervix), which prevents sperm from entering the uterus. °· Thinning the lining of the uterus (endometrium), which prevents a fertilized egg from attaching to the endometrium. °OCPs are highly effective when taken exactly as prescribed. However, OCPs do not prevent STIs (sexually transmitted infections). Safe sex practices, such as using condoms while on an OCP, can help prevent STIs. °Before starting OCPs °Before you start taking OCPs, you may have a physical exam, blood test, and Pap test. However, you are not required to have a pelvic exam in order to be prescribed OCPs. Your health care provider will make sure you are a good candidate for oral contraception. OCPs are not a good option for certain women, including women who smoke and are older than 35 years, and women with a medical history of high blood pressure, deep vein thrombosis, pulmonary embolism, stroke, cardiovascular disease, or peripheral vascular disease. °Discuss with your health care provider the possible side effects of the OCP you may be prescribed. When you start an OCP, be aware that it can take 2-3 months for your body to adjust to changes in hormone levels. °Follow instructions from your health care provider about how to start taking your first cycle of OCPs. Depending on when you start the pill, you may need to use a backup form of birth control, such as condoms, during the first week. Make sure you know what steps to take if you ever forget to take the pill. °Types of oral contraception ° °The most common types of birth control pills contain the hormones estrogen and progestin (synthetic progesterone) or progestin only. °The combination pill °This type of pill contains estrogen and progestin hormones. Combination pills often come in packs of 21, 28, or 91 pills. For each pack, the last 7 pills may not contain hormones, which means you may stop taking the pills for 7 days. Menstrual bleeding occurs during the  week that you do not take the pills or that you take the pills with no hormones in them. °The minipill °This type of pill contains the progestin hormone only. It comes in packs of 28 pills. All 28 pills contain the hormone. You take the pill every day. It is very important to take the pill at the same time each day. °Advantages of oral contraceptive pills °·   Provides reliable and continuous contraception if taken as instructed. °· May treat or decrease symptoms of: °? Menstrual period cramps. °? Irregular menstrual cycle or bleeding. °? Heavy menstrual flow. °? Abnormal uterine bleeding. °? Acne, depending on the type of pill. °? Polycystic ovarian syndrome. °? Endometriosis. °? Iron deficiency anemia. °? Premenstrual symptoms, including premenstrual dysphoric disorder. °· May reduce the risk of endometrial and ovarian cancer. °· Can be used as emergency contraception. °· Prevents mislocated (ectopic) pregnancies and infections of the fallopian tubes. °Things that can make oral contraceptive pills less effective °OCPs can be less effective if: °· You forget to take the pill at the same time every day. This is especially important when taking the minipill. °· You have a stomach or intestinal disease that reduces your body's ability to absorb the pill. °· You take OCPs with other medicines that make OCPs less effective, such as antibiotics, certain HIV medicines, and some seizure medicines. °· You take expired OCPs. °· You forget to restart the pill on day 7, if using the packs of 21 pills. °Risks associated with oral contraceptive pills °Oral contraceptive pills can sometimes cause side effects, such as: °· Headache. °· Depression. °· Trouble sleeping. °· Nausea and vomiting. °· Breast tenderness. °· Irregular bleeding or spotting during the first several months. °· Bloating or fluid retention. °· Increase in blood pressure. °Combination pills are also associated with a small increase in the risk of: °· Blood  clots. °· Heart attack. °· Stroke. °Summary °· Oral contraceptive pills are medicines taken by mouth to prevent pregnancy. They are highly effective when taken exactly as prescribed. °· The most common types of birth control pills contain the hormones estrogen and progestin (synthetic progesterone) or progestin only. °· Before you start taking the pill, you may have a physical exam, blood test, and Pap test. Your health care provider will make sure you are a good candidate for oral contraception. °· The combination pill may come in a 21-day pack, a 28-day pack, or a 91-day pack. The minipill contains the progesterone hormone only and comes in packs of 28 pills. °· Oral contraceptive pills can sometimes cause side effects, such as headache, nausea, breast tenderness, or irregular bleeding. °This information is not intended to replace advice given to you by your health care provider. Make sure you discuss any questions you have with your health care provider. °Document Released: 08/05/2002 Document Revised: 08/08/2016 Document Reviewed: 08/08/2016 °Elsevier Interactive Patient Education © 2019 [employer] ° °

## 2018-07-25 LAB — COMPREHENSIVE METABOLIC PANEL
ALT: 26 IU/L (ref 0–32)
AST: 24 IU/L (ref 0–40)
Albumin/Globulin Ratio: 1.7 (ref 1.2–2.2)
Albumin: 4.3 g/dL (ref 3.9–5.0)
Alkaline Phosphatase: 52 IU/L (ref 39–117)
BUN/Creatinine Ratio: 9 (ref 9–23)
BUN: 5 mg/dL — AB (ref 6–20)
Bilirubin Total: 0.5 mg/dL (ref 0.0–1.2)
CO2: 23 mmol/L (ref 20–29)
CREATININE: 0.58 mg/dL (ref 0.57–1.00)
Calcium: 9.1 mg/dL (ref 8.7–10.2)
Chloride: 104 mmol/L (ref 96–106)
GFR calc Af Amer: 145 mL/min/{1.73_m2} (ref >59)
GFR calc non Af Amer: 126 mL/min/{1.73_m2} (ref >59)
Globulin, Total: 2.6 g/dL (ref 1.5–4.5)
Glucose: 77 mg/dL (ref 65–99)
Potassium: 4.2 mmol/L (ref 3.5–5.2)
Sodium: 141 mmol/L (ref 134–144)
Total Protein: 6.9 g/dL (ref 6.0–8.5)

## 2018-07-25 LAB — LIPID PANEL
Chol/HDL Ratio: 2.9 ratio (ref 0.0–4.4)
Cholesterol, Total: 213 mg/dL — ABNORMAL HIGH (ref 100–199)
HDL: 73 mg/dL (ref >39)
LDL Calculated: 128 mg/dL — ABNORMAL HIGH (ref 0–99)
Triglycerides: 58 mg/dL (ref 0–149)
VLDL Cholesterol Cal: 12 mg/dL (ref 5–40)

## 2018-07-25 LAB — PROLACTIN: Prolactin: 18.9 ng/mL (ref 4.8–23.3)

## 2018-07-25 LAB — CBC
Hematocrit: 40 % (ref 34.0–46.6)
Hemoglobin: 13 g/dL (ref 11.1–15.9)
MCH: 28.9 pg (ref 26.6–33.0)
MCHC: 32.5 g/dL (ref 31.5–35.7)
MCV: 89 fL (ref 79–97)
PLATELETS: 234 10*3/uL (ref 150–450)
RBC: 4.5 x10E6/uL (ref 3.77–5.28)
RDW: 12.2 % (ref 11.7–15.4)
WBC: 5.7 10*3/uL (ref 3.4–10.8)

## 2018-07-25 LAB — HEP, RPR, HIV PANEL
HIV Screen 4th Generation wRfx: NONREACTIVE
Hepatitis B Surface Ag: NEGATIVE
RPR Ser Ql: NONREACTIVE

## 2018-07-25 LAB — HEMOGLOBIN A1C
ESTIMATED AVERAGE GLUCOSE: 97 mg/dL
Hgb A1c MFr Bld: 5 % (ref 4.8–5.6)

## 2018-07-25 LAB — TSH: TSH: 2.16 u[IU]/mL (ref 0.450–4.500)

## 2018-07-25 LAB — HEPATITIS C ANTIBODY: Hep C Virus Ab: 0.1 s/co ratio (ref 0.0–0.9)

## 2018-07-26 LAB — SPECIMEN STATUS REPORT

## 2018-07-26 LAB — BETA HCG QUANT (REF LAB): hCG Quant: <1 m[IU]/mL

## 2018-07-29 ENCOUNTER — Telehealth: Payer: Self-pay | Admitting: Obstetrics and Gynecology

## 2018-07-29 NOTE — Telephone Encounter (Signed)
Patient states she is returning [place] call.  Routing to to [employer], Charity fundraiser

## 2018-07-29 NOTE — Telephone Encounter (Signed)
Spoke with patient. Advised of results as seen below from Dr.Jertson. Advised BhcG was added and was negative. Advised pap smear is still pending and she will be notified of those results as well through a phone call or a letter depending on results. Patient verbalizes understanding. Patient is scheduled for recheck on 10/23/2018 at 10 am with Dr.Jertson. Encounter closed.  Notes recorded by Ginny Forth, RN on 07/29/2018 at 12:00 PM EST Left message to call Cheryl Walter at [phone]. ------  Notes recorded by Romualdo Bolk, MD on 07/25/2018 at 6:05 PM EST Cheryl Walter: please add a BhcG (not stat) to the blood work that was already drawn). Dx is nipple discharge  Cheryl Walter: Please let the patient know that her blood work was all normal. Her pap is pending. She c/o bilateral, clear, minimal nipple d/c at her annual exam. Only a small drop could be expressed from her right nipple (not bloody). She should not express her nipples. If she notices discharge she should keep track of the color and if it is continuing to be bilateral. If she can tell if it is from more than one duct that is helpful. Have her come back in 3 months to f/u and for a repeat breast exam. If she is noticing an increase in d/c she should return prior to the 3 months.

## 2018-07-30 LAB — CYTOLOGY - PAP
CHLAMYDIA, DNA PROBE: NEGATIVE
Diagnosis: UNDETERMINED — AB
HPV: DETECTED — AB
Neisseria Gonorrhea: NEGATIVE
Trichomonas: NEGATIVE

## 2018-08-01 ENCOUNTER — Telehealth: Payer: Self-pay | Admitting: Obstetrics and Gynecology

## 2018-08-01 DIAGNOSIS — R87610 Atypical squamous cells of undetermined significance on cytologic smear of cervix (ASC-US): Secondary | ICD-10-CM

## 2018-08-01 DIAGNOSIS — R87810 Cervical high risk human papillomavirus (HPV) DNA test positive: Principal | ICD-10-CM

## 2018-08-01 NOTE — Telephone Encounter (Signed)
Spoke with patient, advised as seen below per Dr. Oscar La. LMP 08/01/18. SA, no contraceptive. Brief explanation of colpo provided, questions answered. Order for colpo placed for precert. Patient will be out of town 3/9 -3/13. Colpo scheduled on day 12 of cycle, 3/16 at 4pm with Dr. Oscar La. Motrin 800 mg with food and water one hour before procedure. Patient verbalizes understanding and is agreeable.   Routing to provider for final review. Patient is agreeable to disposition. Will close encounter.  Cc: Soundra Pilon, [address] [employer]

## 2018-08-01 NOTE — Telephone Encounter (Signed)
Patient is returning a call to Jill. °

## 2018-08-01 NOTE — Telephone Encounter (Signed)
Patient calling for results.

## 2018-08-01 NOTE — Telephone Encounter (Signed)
-----   Message from Romualdo Bolk, MD sent at 07/31/2018  5:06 PM EST ----- Please inform the patient of her ASCUS, +HPV test and set her up for a colposcopy. Her CT/GC/Trich were negative.

## 2018-08-01 NOTE — Telephone Encounter (Signed)
Left message to call Syana Degraffenreid, RN at [facility] [phone].   

## 2018-08-01 NOTE — Telephone Encounter (Signed)
Patient left voicemail requesting results.

## 2018-08-06 ENCOUNTER — Telehealth: Payer: Self-pay | Admitting: Obstetrics and Gynecology

## 2018-08-06 NOTE — Telephone Encounter (Signed)
Spoke with patient to convey benefits for colposcopy. Patient to call back if she can not keep appointment.

## 2018-08-12 ENCOUNTER — Ambulatory Visit: Payer: Self-pay | Admitting: Obstetrics and Gynecology

## 2018-08-12 NOTE — Telephone Encounter (Signed)
Spoke with patient, reports cough and congestion, request to reschedule colpo. Denies fever/chills. LMP 08/01/18. No contraceptive, SA. Advised patient to call office first day of next menses to schedule colpo. Patient verbalizes understanding and is agreeable.    07/24/18 pap ASCUS +hpv 70mo recall placed  Routing to provider for final review. Patient is agreeable to disposition. Will close encounter.  Cc: Harland Dingwall

## 2018-08-12 NOTE — Telephone Encounter (Signed)
Patient canceled her Colpo appointment today due to "not feeling well and has a cough". She would like to reschedule. To triage to reschedule.

## 2018-10-10 DIAGNOSIS — W01198A Fall on same level from slipping, tripping and stumbling with subsequent striking against other object, initial encounter: Secondary | ICD-10-CM | POA: Diagnosis not present

## 2018-10-10 DIAGNOSIS — F4320 Adjustment disorder, unspecified: Secondary | ICD-10-CM | POA: Diagnosis not present

## 2018-10-10 DIAGNOSIS — S81811A Laceration without foreign body, right lower leg, initial encounter: Secondary | ICD-10-CM | POA: Diagnosis not present

## 2018-10-10 DIAGNOSIS — Y999 Unspecified external cause status: Secondary | ICD-10-CM | POA: Diagnosis not present

## 2018-10-18 ENCOUNTER — Other Ambulatory Visit: Payer: Self-pay

## 2018-10-23 ENCOUNTER — Ambulatory Visit: Payer: BLUE CROSS/BLUE SHIELD | Admitting: Obstetrics and Gynecology

## 2018-10-31 DIAGNOSIS — F4320 Adjustment disorder, unspecified: Secondary | ICD-10-CM | POA: Diagnosis not present

## 2018-11-07 ENCOUNTER — Other Ambulatory Visit: Payer: Self-pay

## 2018-11-11 ENCOUNTER — Telehealth: Payer: Self-pay | Admitting: Obstetrics and Gynecology

## 2018-11-11 ENCOUNTER — Ambulatory Visit: Payer: BLUE CROSS/BLUE SHIELD | Admitting: Obstetrics and Gynecology

## 2018-11-11 NOTE — Telephone Encounter (Signed)
The patient canceled her f/u breast check. Please confirm with the patient that she isn't noticing the nipple discharge any more.

## 2018-11-11 NOTE — Telephone Encounter (Signed)
Patient cancelled her breast recheck appointment today. She is feeling better and will call back to reschedule if need to.

## 2018-11-11 NOTE — Progress Notes (Deleted)
GYNECOLOGY  VISIT   HPI: 28 y.o.   Single Black or African American Not Hispanic or Latino  female   [id number] with No LMP recorded.   here for breast recheck.   GYNECOLOGIC HISTORY: No LMP recorded. Contraception:*** Menopausal hormone therapy: ***        OB History    Gravida  0   Para  0   Term  0   Preterm  0   AB  0   Living  0     SAB  0   TAB  0   Ectopic  0   Multiple  0   Live Births  0              Patient Active Problem List   Diagnosis Date Noted  . GAD (generalized anxiety disorder) 05/26/2014  . Encounter for routine gynecological examination 10/13/2013  . Allergic rhinitis 10/13/2013  . Bipolar disorder, unspecified ([place]) 09/26/2013  . Insomnia 09/26/2013    Past Medical History:  Diagnosis Date  . Allergy    seasonal  . Anxiety   . Asthma   . Depression   . Dysmenorrhea   . Eating disorder     Past Surgical History:  Procedure Laterality Date  . TONSILLECTOMY AND ADENOIDECTOMY      Current Outpatient Medications  Medication Sig Dispense Refill  . albuterol (PROVENTIL HFA;VENTOLIN HFA) 108 (90 BASE) MCG/ACT inhaler Inhale 2 puffs into the lungs every 4 (four) hours as needed for wheezing or shortness of breath. 1 each 6  . albuterol (PROVENTIL) (2.5 MG/3ML) 0.083% nebulizer solution USE 1 VIAL VIA NEBULIZER EVERY 6 HOURS AS NEEDED FOR WHEEZING OR SHORTNESS OF BREATH 150 mL 0  . norethindrone-ethinyl estradiol (JUNEL FE,GILDESS FE,LOESTRIN FE) 1-20 MG-MCG tablet Take 1 tablet by mouth daily. 3 Package 0   No current facility-administered medications for this visit.      ALLERGIES: Patient has no known allergies.  Family History  Problem Relation Age of Onset  . Vision loss Mother   . Miscarriages / [place] Mother   . Kidney disease Mother   . Diabetes Mother   . COPD Mother   . Bipolar disorder Mother   . Vision loss Father   . Alcohol abuse Father   . Depression Father   . Alcohol abuse Sister   . Vision loss  Brother   . Stroke Maternal Grandmother   . Hypertension Maternal Grandmother   . COPD Maternal Grandmother   . Cancer Maternal Grandmother   . Alcohol abuse Maternal Grandmother   . Bipolar disorder Maternal Grandmother   . Hypertension Maternal Grandfather   . Cancer Maternal Grandfather   . Heart disease Paternal Grandmother   . Heart disease Paternal Grandfather     Social History   Socioeconomic History  . Marital status: Single    Spouse name: Not on file  . Number of children: Not on file  . Years of education: Not on file  . Highest education level: Not on file  Occupational History  . Not on file  Social Needs  . Financial resource strain: Not on file  . Food insecurity    Worry: Not on file    Inability: Not on file  . Transportation needs    Medical: Not on file    Non-medical: Not on file  Tobacco Use  . Smoking status: Never Smoker  . Smokeless tobacco: Never Used  Substance and Sexual Activity  . Alcohol use: Not Currently  . Drug  use: No  . Sexual activity: Yes    Birth control/protection: Condom  Lifestyle  . Physical activity    Days per week: Not on file    Minutes per session: Not on file  . Stress: Not on file  Relationships  . Social Musicianconnections    Talks on phone: Not on file    Gets together: Not on file    Attends religious service: Not on file    Active member of club or organization: Not on file    Attends meetings of clubs or organizations: Not on file    Relationship status: Not on file  . Intimate partner violence    Fear of current or ex partner: Not on file    Emotionally abused: Not on file    Physically abused: Not on file    Forced sexual activity: Not on file  Other Topics Concern  . Not on file  Social History Narrative  . Not on file    ROS  PHYSICAL EXAMINATION:    There were no vitals taken for this visit.    General appearance: alert, cooperative and appears stated age Neck: no adenopathy, supple, symmetrical,  trachea midline and thyroid {CHL AMB PHY EX THYROID NORM DEFAULT:[phone]::"normal to inspection and palpation"} Breasts: {Exam; breast:13139::"normal appearance, no masses or tenderness"} Abdomen: soft, non-tender; non distended, no masses,  no organomegaly  Pelvic: External genitalia:  no lesions              Urethra:  normal appearing urethra with no masses, tenderness or lesions              Bartholins and Skenes: normal                 Vagina: normal appearing vagina with normal color and discharge, no lesions              Cervix: {CHL AMB PHY EX CERVIX NORM [phone]::"no lesions"}              Bimanual Exam:  Uterus:  {CHL AMB PHY EX UTERUS NORM [phone]::"normal size, contour, position, consistency, mobility, non-tender"}              Adnexa: {CHL AMB PHY EX ADNEXA NO MASS [phone]::"no mass, fullness, tenderness"}              Rectovaginal: {yes no:[id number]}.  Confirms.              Anus:  normal sphincter tone, no lesions  Chaperone was present for exam.  ASSESSMENT     PLAN    An After Visit Summary was printed and given to the patient.  *** minutes face to face time of which over 50% was spent in counseling.

## 2018-11-12 NOTE — Telephone Encounter (Signed)
Left message to call Kaitlyn at [phone]. 

## 2018-11-12 NOTE — Telephone Encounter (Signed)
Patient is returning a call to Kaitlyn. °

## 2018-11-13 NOTE — Telephone Encounter (Signed)
Spoke with patient. Patient states that she has not noticed any changes to her breast and is not having nipple discharge. Will call to schedule an appointment if she notices any changes. Encounter closed.

## 2018-11-20 DIAGNOSIS — F4320 Adjustment disorder, unspecified: Secondary | ICD-10-CM | POA: Diagnosis not present

## 2018-12-17 DIAGNOSIS — F4320 Adjustment disorder, unspecified: Secondary | ICD-10-CM | POA: Diagnosis not present

## 2019-01-09 DIAGNOSIS — F4320 Adjustment disorder, unspecified: Secondary | ICD-10-CM | POA: Diagnosis not present

## 2019-01-24 DIAGNOSIS — F4320 Adjustment disorder, unspecified: Secondary | ICD-10-CM | POA: Diagnosis not present

## 2019-02-01 DIAGNOSIS — F4320 Adjustment disorder, unspecified: Secondary | ICD-10-CM | POA: Diagnosis not present

## 2019-02-07 ENCOUNTER — Telehealth: Payer: Self-pay | Admitting: Obstetrics and Gynecology

## 2019-02-07 ENCOUNTER — Encounter: Payer: Self-pay | Admitting: Certified Nurse Midwife

## 2019-02-07 ENCOUNTER — Other Ambulatory Visit: Payer: Self-pay

## 2019-02-07 ENCOUNTER — Ambulatory Visit: Payer: BC Managed Care – PPO | Admitting: Certified Nurse Midwife

## 2019-02-07 VITALS — BP 102/70 | HR 68 | Temp 97.4°F | Resp 16 | Wt 172.0 lb

## 2019-02-07 DIAGNOSIS — Z113 Encounter for screening for infections with a predominantly sexual mode of transmission: Secondary | ICD-10-CM

## 2019-02-07 NOTE — Telephone Encounter (Signed)
Spoke with patient.   1. Patient reports yellow, cream colored d/c from right nipple for the last week. Has been warm and swollen. Denies pain currently. Denies breast lumps. No piercing, has had piercing in the past. Denies fever/chills.   2. Requesting STD panel. Denies any current concerns. States partner has been with other females.   3. LMP 02/05/19, no contraceptive. 07/24/18 ASCUS pap, positive hpv, colpo was recommended.   Recommended OV for further evaluation. OV scheduled for today at 4pm with Melvia Heaps, CNM. [id number] prescreen negative, precautions reviewed. Will schedule Colpo while in office.   Routing to [employer], CNM.   Encounter closed.   Cc: Dr. Talbert Nan
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
## 2019-02-07 NOTE — Patient Instructions (Signed)
Preventing Sexually Transmitted Infections, Adult Sexually transmitted infections (STIs) are diseases that are passed (transmitted) from person to person through bodily fluids exchanged during sex or sexual contact. Bodily fluids include saliva, semen, blood, vaginal mucus, and urine. You may have an increased risk for developing an STI if you have unprotected oral, vaginal, or anal sex. Some common STIs include:  Herpes.  Hepatitis B.  Chlamydia.  Gonorrhea.  Syphilis.  HPV (human papillomavirus).  HIV (human immunodeficiency virus), the virus that can cause AIDS (acquired immunodeficiency syndrome). How can I protect myself from sexually transmitted infections? The only way to completely prevent STIs is not to have sex of any kind (practice abstinence). This includes oral, vaginal, or anal sex. If you are sexually active, take these actions to lower your risk of getting an STI:  Have only one sex partner (be monogamous) or limit the number of sexual partners you have.  Stay up-to-date on immunizations. Certain vaccines can lower your risk of getting certain STIs, such as: ? Hepatitis A and B vaccines. You may have been vaccinated as a young child, but likely need a booster shot as a teen or young adult. ? HPV vaccine.  Use methods that prevent the exchange of body fluids between partners (barrier protection) every time you have sex. Barrier protection can be used during oral, vaginal, or anal sex. Commonly used barrier methods include: ? Female condom. ? Female condom. ? Dental dam.  Get tested regularly for STIs. Have your sexual partner get tested regularly as well.  Avoid mixing alcohol, drugs, and sex. Alcohol and drug use can affect your ability to make good decisions and can lead to risky sexual behaviors.  Ask your health care provider about taking pre-exposure prophylaxis (PrEP) to prevent HIV infection if you: ? Have a HIV-positive sexual partner. ? Have multiple sexual  partners or partners who do not know their HIV status, and do not regularly use a condom during sex. ? Use injection drugs and share needles. Birth control pills, injections, implants, and intrauterine devices (IUDs) do not protect against STIs. To prevent both STIs and pregnancy, always use a condom with another form of birth control. Some STIs, such as herpes, are spread through skin to skin contact. A condom does not protect you from getting such STIs. If you or your partner have herpes and there is an active flare with open sores, avoid all sexual contact. Why are these changes important? Taking steps to practice safe sex protects you and others. Many STIs can be cured. However, some STIs are not curable and will affect you for the rest of your life. STIs can be passed on to another person even if you do not have symptoms. What can happen if changes are not made? Certain STIs may:  Require you to take medicine for the rest of your life.  Affect your ability to have children (your fertility).  Increase your risk for developing another STI or certain serious health conditions, such as: ? Cervical cancer. ? Head and neck cancer. ? Pelvic inflammatory disease (PID) in women. ? Organ damage or damage to other parts of your body, if the infection spreads.  Be passed to a baby during childbirth. How are sexually transmitted infections treated? If you or your partner know or think that you may have an STI:  Talk with your health care provider about what can be done to treat it. Some STIs can be treated and cured with medicines.  For curable STIs, you and   your partner should avoid sex during treatment and for several days after treatment is complete.  You and your partner should both be treated at the same time, if there is any chance that your partner is infected as well. If you get treatment but your partner does not, your partner can re-infect you when you resume sexual contact.  Do not  have unprotected sex. Where to find more information Learn more about sexually transmitted diseases and infections from:  Centers for Disease Control and Prevention: ? More information about specific STIs: www.cdc.gov/std ? Find places to get sexual health counseling and treatment for free or for a low cost: gettested.cdc.gov  U.S. Department of Health and Human Services: www.womenshealth.gov/publications/our-publications/fact-sheet/sexually-transmitted-infections.html Summary  The only way to completely prevent STIs is not to have sex (practice abstinence), including oral, vaginal, or anal sex.  STIs can spread through saliva, semen, blood, vaginal mucus, urine, or sexual contact.  If you do have sex, limit your number of sexual partners and use a barrier protection method every time you have sex.  If you develop an STI, get treated right away and ask your partner to be treated as well. Do not resume having sex until both of you have completed treatment for the STI. This information is not intended to replace advice given to you by your health care provider. Make sure you discuss any questions you have with your health care provider. Document Released: 05/11/2016 Document Revised: 10/19/2017 Document Reviewed: 05/11/2016 Elsevier Patient Education  2020 Elsevier Inc.  

## 2019-02-07 NOTE — Telephone Encounter (Signed)
1. Patient is calling to reschedule Colpo.   2. Patient stated that she is also experiencing nipple discharge.

## 2019-02-07 NOTE — Telephone Encounter (Signed)
Spoke with patient while in office.   07/24/18 pap ASCUS, +hpv. Was scheduled for 08/12/18, cancelled by patient.   LMP 02/05/19. No contraceptive.   Colpo scheduled for 9/15 at 3:30pm with Dr. Talbert Nan. Advised to take Motrin 800 mg with food and water one hour before procedure.   Advised patient our office will notify of results from today's OV once they return. Patient verbalizes understanding and is agreeable.   Patient has order previously placed for Colpo.   Routing to Dr. Talbert Nan  Cc: Lerry Liner and Magdalene Patricia

## 2019-02-08 DIAGNOSIS — F4320 Adjustment disorder, unspecified: Secondary | ICD-10-CM | POA: Diagnosis not present

## 2019-02-08 LAB — HEPATITIS C ANTIBODY: Hep C Virus Ab: <0.1 s/co ratio (ref 0.0–0.9)

## 2019-02-08 LAB — VAGINITIS/VAGINOSIS, DNA PROBE
Candida Species: NEGATIVE
Gardnerella vaginalis: NEGATIVE
Trichomonas vaginosis: NEGATIVE

## 2019-02-08 LAB — HIV ANTIBODY (ROUTINE TESTING W REFLEX): HIV Screen 4th Generation wRfx: NONREACTIVE

## 2019-02-08 LAB — RPR: RPR Ser Ql: NONREACTIVE
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
## 2019-02-11 ENCOUNTER — Encounter: Payer: Self-pay | Admitting: Obstetrics and Gynecology

## 2019-02-11 ENCOUNTER — Ambulatory Visit (INDEPENDENT_AMBULATORY_CARE_PROVIDER_SITE_OTHER): Payer: BC Managed Care – PPO | Admitting: Obstetrics and Gynecology

## 2019-02-11 ENCOUNTER — Other Ambulatory Visit: Payer: Self-pay

## 2019-02-11 VITALS — BP 122/80 | HR 68 | Temp 97.3°F | Wt 169.0 lb

## 2019-02-11 DIAGNOSIS — F329 Major depressive disorder, single episode, unspecified: Secondary | ICD-10-CM | POA: Diagnosis not present

## 2019-02-11 DIAGNOSIS — R87810 Cervical high risk human papillomavirus (HPV) DNA test positive: Secondary | ICD-10-CM

## 2019-02-11 DIAGNOSIS — Z01812 Encounter for preprocedural laboratory examination: Secondary | ICD-10-CM

## 2019-02-11 DIAGNOSIS — R87610 Atypical squamous cells of undetermined significance on cytologic smear of cervix (ASC-US): Secondary | ICD-10-CM | POA: Diagnosis not present

## 2019-02-11 LAB — GC/CHLAMYDIA PROBE AMP
Chlamydia trachomatis, NAA: NEGATIVE
Neisseria Gonorrhoeae by PCR: NEGATIVE

## 2019-02-11 LAB — POCT URINE PREGNANCY: Preg Test, Ur: NEGATIVE

## 2019-02-11 MED ORDER — LORAZEPAM 1 MG PO TABS: 1.0000 mg | ORAL_TABLET | Freq: Three times a day (TID) | ORAL | 0 refills | Status: DC | PRN

## 2019-02-11 MED ORDER — CITALOPRAM HYDROBROMIDE 20 MG PO TABS: ORAL_TABLET | ORAL | 1 refills | Status: DC

## 2019-02-11 NOTE — Patient Instructions (Addendum)
Colposcopy Post-procedure Instructions . Cramping is common.  You may take Ibuprofen, Aleve, or Tylenol for the cramping.  This should resolve within the next two to three days.   . You may have bright red spotting or blackish discharge for several days after your procedure.  The discharge occurs because of a topical solution used to stop bleeding at the biopsy site(s).  You should wear a mini pad for the next few days. . Refrain from putting anything in the vagina until the bleeding and/or discharge stops (usually less than a week). . You need to call the office if you have any pelvic pain, fever, heavy bleeding, or foul smelling vaginal discharge. . Shower or bathe as normal . You will be notified within one week of your biopsy results or we will discuss your results at your follow-up appointment if needed.  Major Depressive Disorder, Adult Major depressive disorder (MDD) is a mental health condition. It may also be called clinical depression or unipolar depression. MDD usually causes feelings of sadness, hopelessness, or helplessness. MDD can also cause physical symptoms. It can interfere with work, school, relationships, and other everyday activities. MDD may be mild, moderate, or severe. It may occur once (single episode major depressive disorder) or it may occur multiple times (recurrent major depressive disorder). What are the causes? The exact cause of this condition is not known. MDD is most likely caused by a combination of things, which may include:  Genetic factors. These are traits that are passed along from parent to child.  Individual factors. Your personality, your behavior, and the way you handle your thoughts and feelings may contribute to MDD. This includes personality traits and behaviors learned from others.  Physical factors, such as: ? Differences in the part of your brain that controls emotion. This part of your brain may be different than it is in people who do not have  MDD. ? Long-term (chronic) medical or psychiatric illnesses.  Social factors. Traumatic experiences or major life changes may play a role in the development of MDD. What increases the risk? This condition is more likely to develop in women. The following factors may also make you more likely to develop MDD:  A family history of depression.  Troubled family relationships.  Abnormally low levels of certain brain chemicals.  Traumatic events in childhood, especially abuse or the loss of a parent.  Being under a lot of stress, or long-term stress, especially from upsetting life experiences or losses.  A history of: ? Chronic physical illness. ? Other mental health disorders. ? Substance abuse.  Poor living conditions.  Experiencing social exclusion or discrimination on a regular basis. What are the signs or symptoms? The main symptoms of MDD typically include:  Constant depressed or irritable mood.  Loss of interest in things and activities. MDD symptoms may also include:  Sleeping or eating too much or too little.  Unexplained weight change.  Fatigue or low energy.  Feelings of worthlessness or guilt.  Difficulty thinking clearly or making decisions.  Thoughts of suicide or of harming others.  Physical agitation or weakness.  Isolation. Severe cases of MDD may also occur with other symptoms, such as:  Delusions or hallucinations, in which you imagine things that are not real (psychotic depression).  Low-level depression that lasts at least a year (chronic depression or persistent depressive disorder).  Extreme sadness and hopelessness (melancholic depression).  Trouble speaking and moving (catatonic depression). How is this diagnosed? This condition may be diagnosed based on:  Your symptoms.  Your medical history, including your mental health history. This may involve tests to evaluate your mental health. You may be asked questions about your lifestyle,  including any drug and alcohol use, and how long you have had symptoms of MDD.  A physical exam.  Blood tests to rule out other conditions. You must have a depressed mood and at least four other MDD symptoms most of the day, nearly every day in the same 2-week timeframe before your health care provider can confirm a diagnosis of MDD. How is this treated? This condition is usually treated by mental health professionals, such as psychologists, psychiatrists, and clinical social workers. You may need more than one type of treatment. Treatment may include:  Psychotherapy. This is also called talk therapy or counseling. Types of psychotherapy include: ? Cognitive behavioral therapy (CBT). This type of therapy teaches you to recognize unhealthy feelings, thoughts, and behaviors, and replace them with positive thoughts and actions. ? Interpersonal therapy (IPT). This helps you to improve the way you relate to and communicate with others. ? Family therapy. This treatment includes members of your family.  Medicine to treat anxiety and depression, or to help you control certain emotions and behaviors.  Lifestyle changes, such as: ? Limiting alcohol and drug use. ? Exercising regularly. ? Getting plenty of sleep. ? Making healthy eating choices. ? Spending more time outdoors.  Treatments involving stimulation of the brain can be used in situations with extremely severe symptoms, or when medicine or other therapies do not work over time. These treatments include electroconvulsive therapy, transcranial magnetic stimulation, and vagal nerve stimulation. Follow these instructions at home: Activity  Return to your normal activities as told by your health care provider.  Exercise regularly and spend time outdoors as told by your health care provider. General instructions  Take over-the-counter and prescription medicines only as told by your health care provider.  Do not drink alcohol. If you drink  alcohol, limit your alcohol intake to no more than 1 drink a day for nonpregnant women and 2 drinks a day for men. One drink equals 12 oz of beer, 5 oz of wine, or 1 oz of hard liquor. Alcohol can affect any antidepressant medicines you are taking. Talk to your health care provider about your alcohol use.  Eat a healthy diet and get plenty of sleep.  Find activities that you enjoy doing, and make time to do them.  Consider joining a support group. Your health care provider may be able to recommend a support group.  Keep all follow-up visits as told by your health care provider. This is important. Where to find more information [employer] on Mental Illness  www.nami.org U.S. National Institute of Mental Health  https://carter.com/ National Suicide Prevention Lifeline  1-800-273-TALK 385-823-1308). This is free, 24-hour help. Contact a health care provider if:  Your symptoms get worse.  You develop new symptoms. Get help right away if:  You self-harm.  You have serious thoughts about hurting yourself or others.  You see, hear, taste, smell, or feel things that are not present (hallucinate). This information is not intended to replace advice given to you by your health care provider. Make sure you discuss any questions you have with your health care provider. Document Released: 09/09/2012 Document Revised: 04/27/2017 Document Reviewed: 11/24/2015 Elsevier Patient Education  2020 [employer].

## 2019-02-13 DIAGNOSIS — F4320 Adjustment disorder, unspecified: Secondary | ICD-10-CM | POA: Diagnosis not present

## 2019-02-13 NOTE — Addendum Note (Signed)
Addended by: Dorothy Spark on: 02/13/2019 05:18 PM   Modules accepted: Orders

## 2019-02-14 ENCOUNTER — Other Ambulatory Visit: Payer: Self-pay | Admitting: Obstetrics and Gynecology

## 2019-02-14 DIAGNOSIS — A630 Anogenital (venereal) warts: Secondary | ICD-10-CM | POA: Diagnosis not present

## 2019-02-22 DIAGNOSIS — F4320 Adjustment disorder, unspecified: Secondary | ICD-10-CM | POA: Diagnosis not present

## 2019-02-27 DIAGNOSIS — F4320 Adjustment disorder, unspecified: Secondary | ICD-10-CM | POA: Diagnosis not present

## 2019-03-06 DIAGNOSIS — R1084 Generalized abdominal pain: Secondary | ICD-10-CM | POA: Diagnosis not present

## 2019-03-06 DIAGNOSIS — K219 Gastro-esophageal reflux disease without esophagitis: Secondary | ICD-10-CM | POA: Diagnosis not present

## 2019-03-06 DIAGNOSIS — R197 Diarrhea, unspecified: Secondary | ICD-10-CM | POA: Diagnosis not present

## 2019-03-06 DIAGNOSIS — R194 Change in bowel habit: Secondary | ICD-10-CM | POA: Diagnosis not present

## 2019-03-10 DIAGNOSIS — F4320 Adjustment disorder, unspecified: Secondary | ICD-10-CM | POA: Diagnosis not present

## 2019-03-13 ENCOUNTER — Other Ambulatory Visit: Payer: Self-pay

## 2019-03-13 NOTE — Progress Notes (Signed)
GYNECOLOGY  VISIT   HPI: 28 y.o.   Single Black or African American Not Hispanic or Latino  female   G0P0000 with No LMP recorded.   here for follow on depression. Patient did not start Celexa. She feels a lot of her anxiety was in regards to school, she has caught up in school and her anxiety and depression have improved. Ex is not threatening her at this time. She has a therapist she speaks with weekly, helping with her anxiety.   Patient's right nipple is swollen and painful. Reports clear/yellow nipple discharge, in the last few weeks.  H/o normal prolactin earlier this year, at that time it she had bilateral nipple d/c. In August she had a swollen right nipple, some yellow, white d/c with expression, some blood as well. It started up again a few weeks ago.  No leakage from her left nipple.  No fevers.   GYNECOLOGIC HISTORY: No LMP recorded. Contraception: Abstinence Menopausal hormone therapy: None        OB History    Gravida  0   Para  0   Term  0   Preterm  0   AB  0   Living  0     SAB  0   TAB  0   Ectopic  0   Multiple  0   Live Births  0              Patient Active Problem List   Diagnosis Date Noted  . GAD (generalized anxiety disorder) 05/26/2014  . Encounter for routine gynecological examination 10/13/2013  . Allergic rhinitis 10/13/2013  . Bipolar disorder, unspecified (HCC) 09/26/2013  . Insomnia 09/26/2013    Past Medical History:  Diagnosis Date  . Allergy    seasonal  . Anxiety   . Asthma   . Depression   . Dysmenorrhea   . Eating disorder     Past Surgical History:  Procedure Laterality Date  . TONSILLECTOMY AND ADENOIDECTOMY      Current Outpatient Medications  Medication Sig Dispense Refill  . LORazepam (ATIVAN) 1 MG tablet Take 1 tablet (1 mg total) by mouth every 8 (eight) hours as needed for anxiety. 20 tablet 0   No current facility-administered medications for this visit.      ALLERGIES: Patient has no known  allergies.  Family History  Problem Relation Age of Onset  . Vision loss Mother   . Miscarriages / [place] Mother   . Kidney disease Mother   . Diabetes Mother   . COPD Mother   . Bipolar disorder Mother   . Vision loss Father   . Alcohol abuse Father   . Depression Father   . Alcohol abuse Sister   . Vision loss Brother   . Stroke Maternal Grandmother   . Hypertension Maternal Grandmother   . COPD Maternal Grandmother   . Cancer Maternal Grandmother   . Alcohol abuse Maternal Grandmother   . Bipolar disorder Maternal Grandmother   . Hypertension Maternal Grandfather   . Cancer Maternal Grandfather   . Heart disease Paternal Grandmother   . Heart disease Paternal Grandfather     Social History   Socioeconomic History  . Marital status: Single    Spouse name: Not on file  . Number of children: Not on file  . Years of education: Not on file  . Highest education level: Not on file  Occupational History  . Not on file  Social Needs  . Financial resource strain:  Not on file  . Food insecurity    Worry: Not on file    Inability: Not on file  . Transportation needs    Medical: Not on file    Non-medical: Not on file  Tobacco Use  . Smoking status: Never Smoker  . Smokeless tobacco: Never Used  Substance and Sexual Activity  . Alcohol use: Not Currently  . Drug use: No  . Sexual activity: Not Currently    Partners: Male    Birth control/protection: Abstinence  Lifestyle  . Physical activity    Days per week: Not on file    Minutes per session: Not on file  . Stress: Not on file  Relationships  . Social Herbalist on phone: Not on file    Gets together: Not on file    Attends religious service: Not on file    Active member of club or organization: Not on file    Attends meetings of clubs or organizations: Not on file    Relationship status: Not on file  . Intimate partner violence    Fear of current or ex partner: Not on file    Emotionally  abused: Not on file    Physically abused: Not on file    Forced sexual activity: Not on file  Other Topics Concern  . Not on file  Social History Narrative  . Not on file    Review of Systems  Constitutional:       Swollen painful right nipple Clear/yellow nipple discharge  HENT: Negative.   Eyes: Negative.   Respiratory: Negative.   Cardiovascular: Negative.   Gastrointestinal: Negative.   Genitourinary: Negative.   Musculoskeletal: Negative.   Skin: Negative.   Neurological: Negative.   Endo/Heme/Allergies: Negative.   Psychiatric/Behavioral: Negative.     PHYSICAL EXAMINATION:    BP 118/70 (BP Location: Right Arm, Patient Position: Sitting, Cuff Size: Normal)   Pulse 68   Temp (!) 97.2 F (36.2 C) (Skin)   Wt 172 lb 12.8 oz (78.4 kg)   BMI 30.61 kg/m     General appearance: alert, cooperative and appears stated age Breasts: normal appearance, no masses or tenderness, unable to express d/c currently  ASSESSMENT Bloody right nipple d/c Depression/anxiety improved without ever starting the Celexa    PLAN Right breast imaging She will continue speaking with her Therapist  Call if she wants to try the Celexa   An After Visit Summary was printed and given to the patient.  ~15 minutes face to face time of which over 50% was spent in counseling.

## 2019-03-17 ENCOUNTER — Telehealth: Payer: Self-pay | Admitting: *Deleted

## 2019-03-17 ENCOUNTER — Ambulatory Visit: Payer: BC Managed Care – PPO | Admitting: Obstetrics and Gynecology

## 2019-03-17 ENCOUNTER — Other Ambulatory Visit: Payer: Self-pay

## 2019-03-17 ENCOUNTER — Encounter: Payer: Self-pay | Admitting: Obstetrics and Gynecology

## 2019-03-17 VITALS — BP 118/70 | HR 68 | Temp 97.2°F | Wt 172.8 lb

## 2019-03-17 DIAGNOSIS — N6452 Nipple discharge: Secondary | ICD-10-CM

## 2019-03-17 DIAGNOSIS — F418 Other specified anxiety disorders: Secondary | ICD-10-CM | POA: Diagnosis not present

## 2019-03-17 NOTE — Telephone Encounter (Signed)
-----   Message from Salvadore Dom, MD sent at 03/17/2019  2:48 PM EDT ----- Please schedule her for right breast imaging, bloody right nipple d/c.  Thanks,Yona Kosek

## 2019-03-17 NOTE — Telephone Encounter (Signed)
Spoke with Joaquim Lai at [facility]. Right breast US scheduled for 03/19/19 at 7:30am, arrive at 7:15am.      Patient notified of appt. Patient is agreeable to date and time.   Routing to provider for final review. Patient is agreeable to disposition. Will close encounter.

## 2019-03-19 ENCOUNTER — Ambulatory Visit
Admission: RE | Admit: 2019-03-19 | Discharge: 2019-03-19 | Disposition: A | Discharge status: Home / Self Care | Payer: BC Managed Care – PPO | Source: Ambulatory Visit | Attending: Obstetrics and Gynecology | Admitting: Obstetrics and Gynecology

## 2019-03-19 ENCOUNTER — Other Ambulatory Visit: Payer: Self-pay

## 2019-03-19 DIAGNOSIS — N6452 Nipple discharge: Secondary | ICD-10-CM

## 2019-03-19 DIAGNOSIS — N6489 Other specified disorders of breast: Secondary | ICD-10-CM | POA: Diagnosis not present

## 2019-03-19 MED ORDER — AMOXICILLIN-POT CLAVULANATE 875-125 MG PO TABS: 1.0000 | ORAL_TABLET | Freq: Two times a day (BID) | ORAL | 0 refills | Status: DC

## 2019-03-19 MED ORDER — FLUCONAZOLE 150 MG PO TABS: 150.0000 mg | ORAL_TABLET | Freq: Once | ORAL | 0 refills | Status: AC

## 2019-03-27 DIAGNOSIS — F4320 Adjustment disorder, unspecified: Secondary | ICD-10-CM | POA: Diagnosis not present

## 2019-04-03 DIAGNOSIS — Z800 Family history of malignant neoplasm of digestive organs: Secondary | ICD-10-CM | POA: Diagnosis not present

## 2019-04-03 DIAGNOSIS — K2950 Unspecified chronic gastritis without bleeding: Secondary | ICD-10-CM | POA: Diagnosis not present

## 2019-04-03 DIAGNOSIS — R1084 Generalized abdominal pain: Secondary | ICD-10-CM | POA: Diagnosis not present

## 2019-04-03 DIAGNOSIS — K449 Diaphragmatic hernia without obstruction or gangrene: Secondary | ICD-10-CM | POA: Diagnosis not present

## 2019-04-03 DIAGNOSIS — R197 Diarrhea, unspecified: Secondary | ICD-10-CM | POA: Diagnosis not present

## 2019-04-03 DIAGNOSIS — K648 Other hemorrhoids: Secondary | ICD-10-CM | POA: Diagnosis not present

## 2019-04-08 DIAGNOSIS — F4320 Adjustment disorder, unspecified: Secondary | ICD-10-CM | POA: Diagnosis not present

## 2019-04-14 DIAGNOSIS — Z20828 Contact with and (suspected) exposure to other viral communicable diseases: Secondary | ICD-10-CM | POA: Diagnosis not present

## 2019-04-22 DIAGNOSIS — F4320 Adjustment disorder, unspecified: Secondary | ICD-10-CM | POA: Diagnosis not present

## 2019-05-06 DIAGNOSIS — F4322 Adjustment disorder with anxiety: Secondary | ICD-10-CM | POA: Diagnosis not present

## 2019-05-12 DIAGNOSIS — K5900 Constipation, unspecified: Secondary | ICD-10-CM | POA: Diagnosis not present

## 2019-05-12 DIAGNOSIS — Z800 Family history of malignant neoplasm of digestive organs: Secondary | ICD-10-CM | POA: Diagnosis not present

## 2019-05-12 DIAGNOSIS — R1084 Generalized abdominal pain: Secondary | ICD-10-CM | POA: Diagnosis not present

## 2019-05-26 DIAGNOSIS — F4322 Adjustment disorder with anxiety: Secondary | ICD-10-CM | POA: Diagnosis not present

## 2019-06-07 DIAGNOSIS — Z03818 Encounter for observation for suspected exposure to other biological agents ruled out: Secondary | ICD-10-CM | POA: Diagnosis not present

## 2019-06-28 DIAGNOSIS — F4320 Adjustment disorder, unspecified: Secondary | ICD-10-CM | POA: Diagnosis not present

## 2019-08-07 DIAGNOSIS — Z23 Encounter for immunization: Secondary | ICD-10-CM | POA: Diagnosis not present

## 2019-08-08 DIAGNOSIS — F4322 Adjustment disorder with anxiety: Secondary | ICD-10-CM | POA: Diagnosis not present

## 2019-08-11 ENCOUNTER — Encounter: Payer: Self-pay | Admitting: Certified Nurse Midwife

## 2019-08-13 ENCOUNTER — Encounter: Payer: Self-pay | Admitting: Certified Nurse Midwife

## 2019-08-22 DIAGNOSIS — Z20822 Contact with and (suspected) exposure to covid-19: Secondary | ICD-10-CM | POA: Diagnosis not present

## 2019-08-27 ENCOUNTER — Telehealth: Payer: Self-pay

## 2019-08-27 NOTE — Telephone Encounter (Signed)
04 Recall removed. Patient aware aex due 01/2020. Patient will wait to schedule. She is aware of importance of having this done.

## 2019-08-27 NOTE — Telephone Encounter (Signed)
Patient is in 04 recall regarding nipple discharge she was having that required breast imaging. Per report, patient was to return to imaging facility in 6 weeks for follow up if the nipple discharge did not resolve. Per patient the discharge resolved & she is not having any problems. Can patient be removed from 04 recall? Please advise.  Also, patient had last aex 06/2018 with abnormal pap smear ASCUS HPV HR+. Patient had colposcopy done 01/2019 with negative result. Patient was to be in recall per result. Recall placed showed 01/2020. Should patient be seen now for her aex or pushed back until 01/2020. Please advise. Routing to Dr Oscar La for review.

## 2019-08-27 NOTE — Telephone Encounter (Signed)
Can come out of mammogram recall.  Given her colposcopy was done in 9/20, I would space out her annual until 9/21. Thank you!

## 2019-09-06 DIAGNOSIS — Z23 Encounter for immunization: Secondary | ICD-10-CM | POA: Diagnosis not present

## 2019-09-08 DIAGNOSIS — F4322 Adjustment disorder with anxiety: Secondary | ICD-10-CM | POA: Diagnosis not present

## 2019-09-18 DIAGNOSIS — Z20822 Contact with and (suspected) exposure to covid-19: Secondary | ICD-10-CM | POA: Diagnosis not present

## 2019-09-29 DIAGNOSIS — F4320 Adjustment disorder, unspecified: Secondary | ICD-10-CM | POA: Diagnosis not present

## 2019-10-16 DIAGNOSIS — F4322 Adjustment disorder with anxiety: Secondary | ICD-10-CM | POA: Diagnosis not present

## 2020-04-05 NOTE — Progress Notes (Signed)
29 y.o. G0P0000 Single Black or African American Not Hispanic or Latino female here for annual exam.  Sexually active, same partner x 2 months. Using condoms.  Period Cycle (Days): 28 Period Duration (Days): 4-5 Period Pattern: Regular Menstrual Flow: Moderate Menstrual Control: Tampon, Panty liner Menstrual Control Change Freq (Hours): 5-6 Dysmenorrhea: (!) Severe Dysmenorrhea Symptoms: Cramping, Diarrhea, Nausea (nausea and diarrhea are mild)  Cramps are bad x 1 day, helped with ibuprofen.   Patient's last menstrual period was 04/03/2020.          Sexually active: Yes.    The current method of family planning is condoms All the time.    Exercising: Yes.    Yoga, walking the dog Smoker:  no  Health Maintenance: Pap:  07/24/18 ASCUS HPV +,  2018 WNL per patient  History of abnormal Pap:  Yes Colposcopy 02/11/19 BX neg  MMG:  03/19/19 right breast US Bi-rads 3 probably benign.  BMD:   None  Colonoscopy: none  TDaP:  2014  Gardasil: 2 doses complete     reports that she has never smoked. She has never used smokeless tobacco. She reports previous alcohol use. She reports that she does not use drugs. Just occasional ETOH. 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   Past Medical History:  Diagnosis Date  . Allergy    seasonal  . Anxiety   . Asthma   . Depression   . Dysmenorrhea   . Eating disorder     Past Surgical History:  Procedure Laterality Date  . TONSILLECTOMY AND ADENOIDECTOMY      No current outpatient medications on file.   No current facility-administered medications for this visit.    Family History  Problem Relation Age of Onset  . Vision loss Mother   . Miscarriages / [place] Mother   . Kidney disease Mother   . Diabetes Mother   . COPD Mother   . Bipolar disorder Mother   . Vision loss Father   . Alcohol abuse Father   . Depression Father   . Alcohol abuse Sister   . Vision loss Brother   . Stroke Maternal Grandmother   . Hypertension Maternal  Grandmother   . COPD Maternal Grandmother   . Cancer Maternal Grandmother   . Alcohol abuse Maternal Grandmother   . Bipolar disorder Maternal Grandmother   . Breast cancer Maternal Grandmother   . Hypertension Maternal Grandfather   . Cancer Maternal Grandfather   . Heart disease Paternal Grandmother   . Heart disease Paternal Grandfather     Review of Systems  All other systems reviewed and are negative.   Exam:   BP 98/64   Pulse 97   Ht 5' 3.25" (1.607 m)   Wt 176 lb (79.8 kg)   LMP 04/03/2020   SpO2 99%   BMI 30.93 kg/m   Weight change: @WEIGHTCHANGE @ Height:   Height: 5' 3.25" (160.7 cm)  Ht Readings from Last 3 Encounters:  04/06/20 5' 3.25" (1.607 m)  07/24/18 5\' 3"  (1.6 m)  08/03/14 5\' 3"  (1.6 m)    General appearance: alert, cooperative and appears stated age Head: Normocephalic, without obvious abnormality, atraumatic Neck: no adenopathy, supple, symmetrical, trachea midline and thyroid normal to inspection and palpation Lungs: clear to auscultation bilaterally Cardiovascular: regular rate and rhythm Breasts: normal appearance, no masses or tenderness Abdomen: soft, non-tender; non distended,  no masses,  no organomegaly Extremities: extremities normal, atraumatic, no cyanosis or edema Skin: Skin color, texture, turgor normal. No rashes  or lesions Lymph nodes: Cervical, supraclavicular, and axillary nodes normal. No abnormal inguinal nodes palpated Neurologic: Grossly normal   Pelvic: External genitalia:  no lesions              Urethra:  normal appearing urethra with no masses, tenderness or lesions              Bartholins and Skenes: normal                 Vagina: normal appearing vagina with normal color and discharge, no lesions              Cervix: no lesions               Bimanual Exam:  Uterus:  normal size, contour, position, consistency, mobility, non-tender and anteverted              Adnexa: no mass, fullness, tenderness                Rectovaginal: Confirms               Anus:  normal sphincter tone, no lesions  Shanon Pettty chaperoned for the exam.  A:  Well Woman with normal exam  FH DM  Condoms for contraception  H/O ASCUS, +HPV pap with negative colposcopy last year  P:   Pap with HPV  Discussed breast self exam  Discussed calcium and vit D intake  Screening STD, screening labs, HgbA1C  Ella for emergency contraception

## 2020-04-06 ENCOUNTER — Ambulatory Visit: Payer: 59 | Admitting: Obstetrics and Gynecology

## 2020-04-06 ENCOUNTER — Encounter: Payer: Self-pay | Admitting: Obstetrics and Gynecology

## 2020-04-06 ENCOUNTER — Other Ambulatory Visit (HOSPITAL_COMMUNITY)
Admission: RE | Admit: 2020-04-06 | Discharge: 2020-04-06 | Disposition: A | Discharge status: Home / Self Care | Payer: 59 | Source: Ambulatory Visit | Attending: Obstetrics and Gynecology | Admitting: Obstetrics and Gynecology

## 2020-04-06 ENCOUNTER — Other Ambulatory Visit: Payer: Self-pay

## 2020-04-06 VITALS — BP 98/64 | HR 97 | Ht 63.25 in | Wt 176.0 lb

## 2020-04-06 DIAGNOSIS — Z23 Encounter for immunization: Secondary | ICD-10-CM

## 2020-04-06 DIAGNOSIS — Z113 Encounter for screening for infections with a predominantly sexual mode of transmission: Secondary | ICD-10-CM | POA: Insufficient documentation

## 2020-04-06 DIAGNOSIS — Z0000 Encounter for general adult medical examination without abnormal findings: Secondary | ICD-10-CM | POA: Diagnosis not present

## 2020-04-06 DIAGNOSIS — Z124 Encounter for screening for malignant neoplasm of cervix: Secondary | ICD-10-CM | POA: Insufficient documentation

## 2020-04-06 DIAGNOSIS — Z01419 Encounter for gynecological examination (general) (routine) without abnormal findings: Secondary | ICD-10-CM

## 2020-04-06 DIAGNOSIS — Z833 Family history of diabetes mellitus: Secondary | ICD-10-CM

## 2020-04-06 MED ORDER — ELLA 30 MG PO TABS: 1.0000 | ORAL_TABLET | Freq: Once | ORAL | 0 refills | Status: AC

## 2020-04-06 NOTE — Patient Instructions (Signed)
EXERCISE AND DIET:  We recommended that you start or continue a regular exercise program for good health. Regular exercise means any activity that makes your heart beat faster and makes you sweat.  We recommend exercising at least 30 minutes per day at least 3 days a week, preferably 4 or 5.  We also recommend a diet low in fat and sugar.  Inactivity, poor dietary choices and obesity can cause diabetes, heart attack, stroke, and kidney damage, among others.    ALCOHOL AND SMOKING:  Women should limit their alcohol intake to no more than 7 drinks/beers/glasses of wine (combined, not each!) per week. Moderation of alcohol intake to this level decreases your risk of breast cancer and liver damage. And of course, no recreational drugs are part of a healthy lifestyle.  And absolutely no smoking or even second hand smoke. Most people know smoking can cause heart and lung diseases, but did you know it also contributes to weakening of your bones? Aging of your skin?  Yellowing of your teeth and nails?  CALCIUM AND VITAMIN D:  Adequate intake of calcium and Vitamin D are recommended.  The recommendations for exact amounts of these supplements seem to change often, but generally speaking 1,000 mg of calcium (between diet and supplement) and 800 units of Vitamin D per day seems prudent. Certain women may benefit from higher intake of Vitamin D.  If you are among these women, your doctor will have told you during your visit.    PAP SMEARS:  Pap smears, to check for cervical cancer or precancers,  have traditionally been done yearly, although recent scientific advances have shown that most women can have pap smears less often.  However, every woman still should have a physical exam from her gynecologist every year. It will include a breast check, inspection of the vulva and vagina to check for abnormal growths or skin changes, a visual exam of the cervix, and then an exam to evaluate the size and shape of the uterus and  ovaries.  And after 30 years of age, a rectal exam is indicated to check for rectal cancers. We will also provide age appropriate advice regarding health maintenance, like when you should have certain vaccines, screening for sexually transmitted diseases, bone density testing, colonoscopy, mammograms, etc.   MAMMOGRAMS:  All women over 40 years old should have a yearly mammogram. Many facilities now offer a "3D" mammogram, which may cost around $50 extra out of pocket. If possible,  we recommend you accept the option to have the 3D mammogram performed.  It both reduces the number of women who will be called back for extra views which then turn out to be normal, and it is better than the routine mammogram at detecting truly abnormal areas.    COLON CANCER SCREENING: Now recommend starting at age 45. At this time colonoscopy is not covered for routine screening until 50. There are take home tests that can be done between 45-49.   COLONOSCOPY:  Colonoscopy to screen for colon cancer is recommended for all women at age 50.  We know, you hate the idea of the prep.  We agree, BUT, having colon cancer and not knowing it is worse!!  Colon cancer so often starts as a polyp that can be seen and removed at colonscopy, which can quite literally save your life!  And if your first colonoscopy is normal and you have no family history of colon cancer, most women don't have to have it again for   10 years.  Once every ten years, you can do something that may end up saving your life, right?  We will be happy to help you get it scheduled when you are ready.  Be sure to check your insurance coverage so you understand how much it will cost.  It may be covered as a preventative service at no cost, but you should check your particular policy.      Breast Self-Awareness Breast self-awareness means being familiar with how your breasts look and feel. It involves checking your breasts regularly and reporting any changes to your  health care provider. Practicing breast self-awareness is important. A change in your breasts can be a sign of a serious medical problem. Being familiar with how your breasts look and feel allows you to find any problems early, when treatment is more likely to be successful. All women should practice breast self-awareness, including women who have had breast implants. How to do a breast self-exam One way to learn what is normal for your breasts and whether your breasts are changing is to do a breast self-exam. To do a breast self-exam: Look for Changes  1. Remove all the clothing above your waist. 2. Stand in front of a mirror in a room with good lighting. 3. Put your hands on your hips. 4. Push your hands firmly downward. 5. Compare your breasts in the mirror. Look for differences between them (asymmetry), such as: ? Differences in shape. ? Differences in size. ? Puckers, dips, and bumps in one breast and not the other. 6. Look at each breast for changes in your skin, such as: ? Redness. ? Scaly areas. 7. Look for changes in your nipples, such as: ? Discharge. ? Bleeding. ? Dimpling. ? Redness. ? A change in position. Feel for Changes Carefully feel your breasts for lumps and changes. It is best to do this while lying on your back on the floor and again while sitting or standing in the shower or tub with soapy water on your skin. Feel each breast in the following way:  Place the arm on the side of the breast you are examining above your head.  Feel your breast with the other hand.  Start in the nipple area and make  inch (2 cm) overlapping circles to feel your breast. Use the pads of your three middle fingers to do this. Apply light pressure, then medium pressure, then firm pressure. The light pressure will allow you to feel the tissue closest to the skin. The medium pressure will allow you to feel the tissue that is a little deeper. The firm pressure will allow you to feel the tissue  close to the ribs.  Continue the overlapping circles, moving downward over the breast until you feel your ribs below your breast.  Move one finger-width toward the center of the body. Continue to use the  inch (2 cm) overlapping circles to feel your breast as you move slowly up toward your collarbone.  Continue the up and down exam using all three pressures until you reach your armpit.  Write Down What You Find  Write down what is normal for each breast and any changes that you find. Keep a written record with breast changes or normal findings for each breast. By writing this information down, you do not need to depend only on memory for size, tenderness, or location. Write down where you are in your menstrual cycle, if you are still menstruating. If you are having trouble noticing differences   in your breasts, do not get discouraged. With time you will become more familiar with the variations in your breasts and more comfortable with the exam. How often should I examine my breasts? Examine your breasts every month. If you are breastfeeding, the best time to examine your breasts is after a feeding or after using a breast pump. If you menstruate, the best time to examine your breasts is 5-7 days after your period is over. During your period, your breasts are lumpier, and it may be more difficult to notice changes. When should I see my health care provider? See your health care provider if you notice:  A change in shape or size of your breasts or nipples.  A change in the skin of your breast or nipples, such as a reddened or scaly area.  Unusual discharge from your nipples.  A lump or thick area that was not there before.  Pain in your breasts.  Anything that concerns you.  

## 2020-04-07 LAB — HEMOGLOBIN A1C
Est. average glucose Bld gHb Est-mCnc: 100 mg/dL
Hgb A1c MFr Bld: 5.1 % (ref 4.8–5.6)

## 2020-04-07 LAB — COMPREHENSIVE METABOLIC PANEL
ALT: 14 IU/L (ref 0–32)
AST: 21 IU/L (ref 0–40)
Albumin/Globulin Ratio: 1.5 (ref 1.2–2.2)
Albumin: 4.1 g/dL (ref 3.9–5.0)
Alkaline Phosphatase: 46 IU/L (ref 44–121)
BUN/Creatinine Ratio: 6 — ABNORMAL LOW (ref 9–23)
BUN: 4 mg/dL — ABNORMAL LOW (ref 6–20)
Bilirubin Total: 0.3 mg/dL (ref 0.0–1.2)
CO2: 26 mmol/L (ref 20–29)
Calcium: 9.2 mg/dL (ref 8.7–10.2)
Chloride: 102 mmol/L (ref 96–106)
Creatinine, Ser: 0.64 mg/dL (ref 0.57–1.00)
GFR calc Af Amer: 138 mL/min/{1.73_m2} (ref >59)
GFR calc non Af Amer: 120 mL/min/{1.73_m2} (ref >59)
Globulin, Total: 2.7 g/dL (ref 1.5–4.5)
Glucose: 83 mg/dL (ref 65–99)
Potassium: 4.2 mmol/L (ref 3.5–5.2)
Sodium: 139 mmol/L (ref 134–144)
Total Protein: 6.8 g/dL (ref 6.0–8.5)

## 2020-04-07 LAB — CBC
Hematocrit: 38.7 % (ref 34.0–46.6)
Hemoglobin: 13.1 g/dL (ref 11.1–15.9)
MCH: 30 pg (ref 26.6–33.0)
MCHC: 33.9 g/dL (ref 31.5–35.7)
MCV: 89 fL (ref 79–97)
Platelets: 225 10*3/uL (ref 150–450)
RBC: 4.37 x10E6/uL (ref 3.77–5.28)
RDW: 12.2 % (ref 11.7–15.4)
WBC: 5.4 10*3/uL (ref 3.4–10.8)

## 2020-04-07 LAB — LIPID PANEL
Chol/HDL Ratio: 3 ratio (ref 0.0–4.4)
Cholesterol, Total: 213 mg/dL — ABNORMAL HIGH (ref 100–199)
HDL: 71 mg/dL (ref >39)
LDL Chol Calc (NIH): 131 mg/dL — ABNORMAL HIGH (ref 0–99)
Triglycerides: 60 mg/dL (ref 0–149)
VLDL Cholesterol Cal: 11 mg/dL (ref 5–40)

## 2020-04-07 LAB — HIV ANTIBODY (ROUTINE TESTING W REFLEX): HIV Screen 4th Generation wRfx: NONREACTIVE

## 2020-04-07 LAB — RPR: RPR Ser Ql: NONREACTIVE

## 2020-04-08 LAB — CYTOLOGY - PAP
Chlamydia: NEGATIVE
Comment: NEGATIVE
Comment: NEGATIVE
Comment: NEGATIVE
Comment: NORMAL
Diagnosis: UNDETERMINED — AB
High risk HPV: NEGATIVE
Neisseria Gonorrhea: NEGATIVE
Trichomonas: NEGATIVE

## 2020-04-09 ENCOUNTER — Telehealth: Payer: Self-pay

## 2020-04-09 NOTE — Telephone Encounter (Signed)
Patient is calling to discuss results

## 2020-04-12 NOTE — Telephone Encounter (Signed)
Left message for pt to return call to triage RN. 

## 2020-04-13 NOTE — Telephone Encounter (Signed)
Message left to return call to Triage Nurse at [phone].    

## 2020-04-14 NOTE — Telephone Encounter (Signed)
Patient returned call. Patient just wanting to confirm pap smear results were normal. RN reviewed results with patient as seen below from Dr. Oscar La and reassured her pap was normal. Patient appreciative of phone call.   [place] Your pap returned with atypical cells of undetermined significance. With this pap results the plan is centered around the HPV results. Your HPV test was negative. With a negative HPV test, this pap result is typically considered a variant of normal. Since you had +HPV testing last year, you should have a follow up pap in one year.  Your cervical STD testing was negative.  Please call with any questions. Take care, Cheryl Walter  Encounter closed.

## 2020-12-08 IMAGING — US US BREAST*R* LIMITED INC AXILLA
1 series · 3 of 3 positions shown · non-contrast
Comparison: None

CLINICAL DATA: Patient with history of intermittent non spontaneous
right nipple discharge. Patient has a history of right nipple
piercing. She has removed the piercing approximately 1 year prior.
Patient states the intermittent nipple discharge started
approximately 1 and half months ago. She states that there is
redness along the lateral aspect of her nipple at the site of prior
piercing and a small amount of yellowish discharge that comes out if
she squeezes. She states it then improves for a period of 1-2 weeks
and then will return.

EXAM:
ULTRASOUND OF THE RIGHT BREAST

[Series 1: us breast*right* limited inc axilla · 0.06mm/px · 3 of 3 slices shown]
[im 1/3]
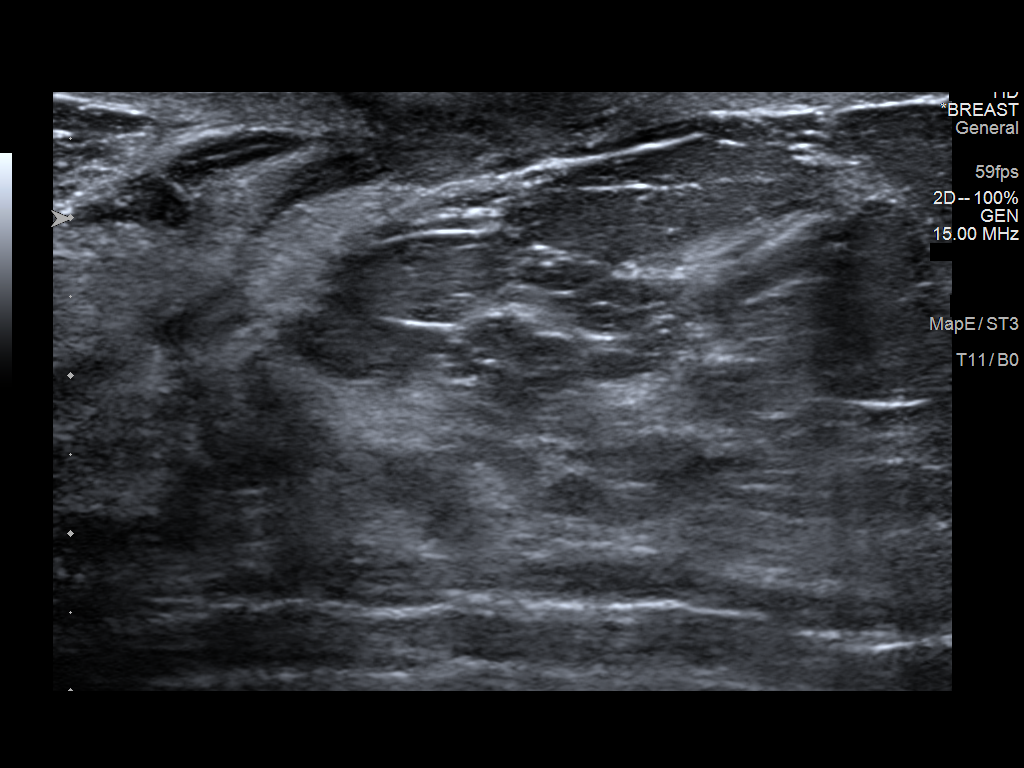
[im 2/3]
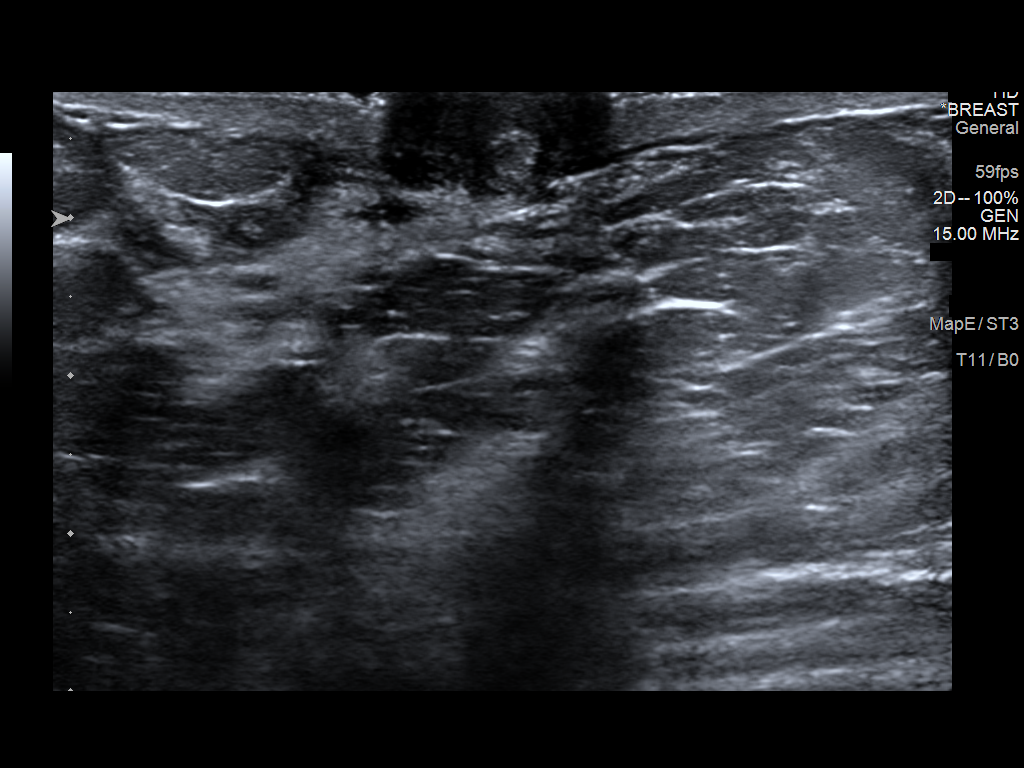
[im 3/3]
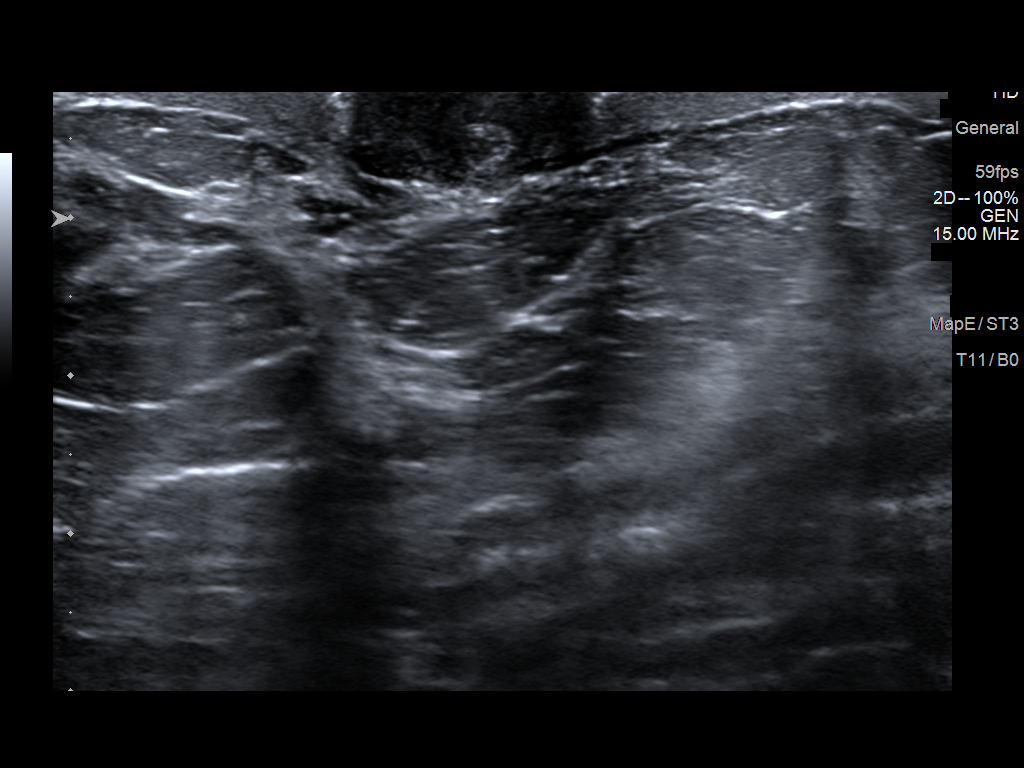

[3 of 3 positions shown; findings below may reference images not displayed]

FINDINGS: On physical exam, there is a small hole within the lateral aspect of
the nipple at the site of prior piercing. Otherwise normal
appearance of the nipple areolar complex. No significant cutaneous
redness identified.

Targeted ultrasound is performed, showing no retroareolar mass or
fluid collection.
IMPRESSION: Overall findings are suggestive of an intermittent physiologic
nipple discharge, potentially secondary to an
infectious/inflammatory process given the non spontaneous nature and
reported color of discharge.

RECOMMENDATION:
Patient was instructed to follow-up with her GYN physician and
consider a course of antibiotic therapy.

Patient was instructed to return in 6 weeks for re-evaluation if the
nipple discharge does not resolve. Re-evaluation in 6 weeks would
include mammogram, ultrasound and possible scheduling for breast
MRI.

I have discussed the findings and recommendations with the patient.
If applicable, a reminder letter will be sent to the patient
regarding the next appointment.

BI-RADS CATEGORY  3: Probably benign.

## 2021-04-06 NOTE — Progress Notes (Signed)
30 y.o. G0P0000 Single Black or African American Not Hispanic or Latino female here for annual exam.   Period Cycle (Days): 28 Period Duration (Days): 5 Period Pattern: Regular Menstrual Flow: Moderate Menstrual Control: Tampon Menstrual Control Change Freq (Hours): 6 Dysmenorrhea: (!) Moderate Dysmenorrhea Symptoms: Cramping, Nausea, Headache  Sexually active, same partner x 2 months. Some entry dyspareunia, sore after sex. Not using a lubricant.   She has a h/o HSV. She has had issues with perianal irritation in the last few months, feels like a cut, not sure if it is related to HSV.  In the past she tried Valtrex for her symptoms and it didn't help. She knows she has HSV secondary to a + serology. She has had 2-3 episodes of blisters last year.   She has IBS, has constipation. Only has a BM 1-2 x a week.   Her father died early this year. She is doing okay. Has some anxiety in regards to work and family, has a Veterinary surgeon. Mostly okay, declines medication.   Patient's last menstrual period was 03/19/2020.          Sexually active: Yes.    The current method of family planning is condoms always .    Exercising: Yes.     Walking and yoga  Smoker:  no  Health Maintenance: Pap: 04/06/20 ASCUS HR HPV Neg, 07/24/18 ASCUS HPV +,  2018 WNL per patient  History of abnormal Pap:  Yes Colposcopy 02/11/19 BX neg  History of abnormal Pap:  yes MMG:  03/19/19 [place] right breast Bi-rads 3 benign  BMD:   none  Colonoscopy: none  TDaP:  02/26/13  Gardasil: complete    reports that she has never smoked. She has never used smokeless tobacco. She reports that she does not currently use alcohol. She reports that she does not use drugs. Social ETOH. 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    Past Medical History:  Diagnosis Date   Allergy    seasonal   Anxiety    Asthma    Depression    Dysmenorrhea    Eating disorder     Past Surgical History:  Procedure Laterality Date    TONSILLECTOMY AND ADENOIDECTOMY      Current Outpatient Medications  Medication Sig Dispense Refill   phentermine (ADIPEX-P) 37.5 MG tablet Take 18.75-37.5 mg by mouth daily.     No current facility-administered medications for this visit.    Family History  Problem Relation Age of Onset   Vision loss Mother    Miscarriages / [place] Mother    Kidney disease Mother    Diabetes Mother    COPD Mother    Bipolar disorder Mother    Vision loss Father    Alcohol abuse Father    Depression Father    Alcohol abuse Sister    Vision loss Brother    Stroke Maternal Grandmother    Hypertension Maternal Grandmother    COPD Maternal Grandmother    Cancer Maternal Grandmother    Alcohol abuse Maternal Grandmother    Bipolar disorder Maternal Grandmother    Breast cancer Maternal Grandmother    Hypertension Maternal Grandfather    Cancer Maternal Grandfather    Heart disease Paternal Grandmother    Heart disease Paternal Grandfather   Father died earlier this year of stomach cancer at 45.  Review of Systems  Psychiatric/Behavioral:  The patient is nervous/anxious.   All other systems reviewed and are negative. She has a therapist.  Exam:   BP 122/60   Pulse 85   Ht 5\' 3"  (1.6 m)   Wt 175 lb (79.4 kg)   LMP 03/19/2020   SpO2 99%   BMI 31.00 kg/m   Weight change: @WEIGHTCHANGE @ Height:   Height: 5\' 3"  (160 cm)  Ht Readings from Last 3 Encounters:  04/14/21 5\' 3"  (1.6 m)  04/06/20 5' 3.25" (1.607 m)  07/24/18 5\' 3"  (1.6 m)    General appearance: alert, cooperative and appears stated age Head: Normocephalic, without obvious abnormality, atraumatic Neck: no adenopathy, supple, symmetrical, trachea midline and thyroid normal to inspection and palpation Lungs: clear to auscultation bilaterally Cardiovascular: regular rate and rhythm Breasts: normal appearance, no masses or tenderness Abdomen: soft, non-tender; non distended,  no masses,  no organomegaly Extremities:  extremities normal, atraumatic, no cyanosis or edema Skin: Skin color, texture, turgor normal. No rashes or lesions Lymph nodes: Cervical, supraclavicular, and axillary nodes normal. No abnormal inguinal nodes palpated Neurologic: Grossly normal   Pelvic: External genitalia:  no lesions              Urethra:  normal appearing urethra with no masses, tenderness or lesions              Bartholins and Skenes: normal                 Vagina: normal appearing vagina with normal color and discharge, no lesions              Cervix: no lesions               Bimanual Exam:  Uterus:  normal size, contour, position, consistency, mobility, non-tender              Adnexa: no mass, fullness, tenderness               Rectovaginal: Confirms               Anus:  normal sphincter tone, no lesions  04/16/21 chaperoned for the exam.  1. Well woman exam Discussed breast self exam Discussed calcium and vit D intake   2. History of herpes genitalis - valACYclovir (VALTREX) 500 MG tablet; Take one tablet po BID x 3 days as needed.  Dispense: 30 tablet; Refill: 1  3. Dysmenorrhea Helped with OTC NSAID's  4. Screening for cervical cancer - Cytology - PAP  5. Laboratory exam ordered as part of routine general medical examination - CBC - Comprehensive metabolic panel - Lipid panel  6. Family history of diabetes mellitus (DM) - Hemoglobin A1c  7. Weight gain - TSH  8. Constipation, unspecified constipation type -Discussed fluids, fruit, fiber, magnesium, miralax and metamucil - TSH  9. Screening examination for STD (sexually transmitted disease) - Cytology - PAP - RPR - HIV Antibody (routine testing w rflx) - Hepatitis C antibody

## 2021-04-14 ENCOUNTER — Other Ambulatory Visit (HOSPITAL_COMMUNITY)
Admission: RE | Admit: 2021-04-14 | Discharge: 2021-04-14 | Disposition: A | Discharge status: Home / Self Care | Payer: 59 | Source: Ambulatory Visit | Attending: Obstetrics and Gynecology | Admitting: Obstetrics and Gynecology

## 2021-04-14 ENCOUNTER — Encounter: Payer: Self-pay | Admitting: Obstetrics and Gynecology

## 2021-04-14 ENCOUNTER — Other Ambulatory Visit: Payer: Self-pay

## 2021-04-14 ENCOUNTER — Ambulatory Visit (INDEPENDENT_AMBULATORY_CARE_PROVIDER_SITE_OTHER): Payer: 59 | Admitting: Obstetrics and Gynecology

## 2021-04-14 VITALS — BP 122/60 | HR 85 | Ht 63.0 in | Wt 175.0 lb

## 2021-04-14 DIAGNOSIS — Z113 Encounter for screening for infections with a predominantly sexual mode of transmission: Secondary | ICD-10-CM

## 2021-04-14 DIAGNOSIS — Z833 Family history of diabetes mellitus: Secondary | ICD-10-CM

## 2021-04-14 DIAGNOSIS — N946 Dysmenorrhea, unspecified: Secondary | ICD-10-CM | POA: Diagnosis not present

## 2021-04-14 DIAGNOSIS — Z8619 Personal history of other infectious and parasitic diseases: Secondary | ICD-10-CM

## 2021-04-14 DIAGNOSIS — K5900 Constipation, unspecified: Secondary | ICD-10-CM

## 2021-04-14 DIAGNOSIS — Z124 Encounter for screening for malignant neoplasm of cervix: Secondary | ICD-10-CM | POA: Diagnosis not present

## 2021-04-14 DIAGNOSIS — Z01419 Encounter for gynecological examination (general) (routine) without abnormal findings: Secondary | ICD-10-CM | POA: Diagnosis not present

## 2021-04-14 DIAGNOSIS — Z0000 Encounter for general adult medical examination without abnormal findings: Secondary | ICD-10-CM

## 2021-04-14 DIAGNOSIS — R635 Abnormal weight gain: Secondary | ICD-10-CM

## 2021-04-14 MED ORDER — VALACYCLOVIR HCL 500 MG PO TABS: ORAL_TABLET | ORAL | 1 refills | Status: AC

## 2021-04-14 NOTE — Patient Instructions (Signed)
EXERCISE   We recommended that you start or continue a regular exercise program for good health. Physical activity is anything that gets your body moving, some is better than none. The CDC recommends 150 minutes per week of Moderate-Intensity Aerobic Activity and 2 or more days of Muscle Strengthening Activity.  Benefits of exercise are limitless: helps weight loss/weight maintenance, improves mood and energy, helps with depression and anxiety, improves sleep, tones and strengthens muscles, improves balance, improves bone density, protects from chronic conditions such as heart disease, high blood pressure and diabetes and so much more. To learn more visit: https://www.cdc.gov/physicalactivity/index.html  DIET: Good nutrition starts with a healthy diet of fruits, vegetables, whole grains, and lean protein sources. Drink plenty of water for hydration. Minimize empty calories, sodium, sweets. For more information about dietary recommendations visit: https://health.gov/our-work/nutrition-physical-activity/dietary-guidelines and https://www.myplate.gov/  ALCOHOL:  Women should limit their alcohol intake to no more than 7 drinks/beers/glasses of wine (combined, not each!) per week. Moderation of alcohol intake to this level decreases your risk of breast cancer and liver damage.  If you are concerned that you may have a problem, or your friends have told you they are concerned about your drinking, there are many resources to help. A well-known program that is free, effective, and available to all people all over the nation is Alcoholics Anonymous.  Check out this site to learn more: https://www.aa.org/   CALCIUM AND VITAMIN D:  Adequate intake of calcium and Vitamin D are recommended for bone health.  You should be getting between 1000-1200 mg of calcium and 800 units of Vitamin D daily between diet and supplements  PAP SMEARS:  Pap smears, to check for cervical cancer or precancers,  have traditionally been  done yearly, scientific advances have shown that most women can have pap smears less often.  However, every woman still should have a physical exam from her gynecologist every year. It will include a breast check, inspection of the vulva and vagina to check for abnormal growths or skin changes, a visual exam of the cervix, and then an exam to evaluate the size and shape of the uterus and ovaries. We will also provide age appropriate advice regarding health maintenance, like when you should have certain vaccines, screening for sexually transmitted diseases, bone density testing, colonoscopy, mammograms, etc.   MAMMOGRAMS:  All women over 40 years old should have a routine mammogram.   COLON CANCER SCREENING: Now recommend starting at age 45. At this time colonoscopy is not covered for routine screening until 50. There are take home tests that can be done between 45-49.   COLONOSCOPY:  Colonoscopy to screen for colon cancer is recommended for all women at age 50.  We know, you hate the idea of the prep.  We agree, BUT, having colon cancer and not knowing it is worse!!  Colon cancer so often starts as a polyp that can be seen and removed at colonscopy, which can quite literally save your life!  And if your first colonoscopy is normal and you have no family history of colon cancer, most women don't have to have it again for 10 years.  Once every ten years, you can do something that may end up saving your life, right?  We will be happy to help you get it scheduled when you are ready.  Be sure to check your insurance coverage so you understand how much it will cost.  It may be covered as a preventative service at no cost, but you should check   your particular policy.      Breast Self-Awareness Breast self-awareness means being familiar with how your breasts look and feel. It involves checking your breasts regularly and reporting any changes to your health care provider. Practicing breast self-awareness is  important. A change in your breasts can be a sign of a serious medical problem. Being familiar with how your breasts look and feel allows you to find any problems early, when treatment is more likely to be successful. All women should practice breast self-awareness, including women who have had breast implants. How to do a breast self-exam One way to learn what is normal for your breasts and whether your breasts are changing is to do a breast self-exam. To do a breast self-exam: Look for Changes  Remove all the clothing above your waist. Stand in front of a mirror in a room with good lighting. Put your hands on your hips. Push your hands firmly downward. Compare your breasts in the mirror. Look for differences between them (asymmetry), such as: Differences in shape. Differences in size. Puckers, dips, and bumps in one breast and not the other. Look at each breast for changes in your skin, such as: Redness. Scaly areas. Look for changes in your nipples, such as: Discharge. Bleeding. Dimpling. Redness. A change in position. Feel for Changes Carefully feel your breasts for lumps and changes. It is best to do this while lying on your back on the floor and again while sitting or standing in the shower or tub with soapy water on your skin. Feel each breast in the following way: Place the arm on the side of the breast you are examining above your head. Feel your breast with the other hand. Start in the nipple area and make  inch (2 cm) overlapping circles to feel your breast. Use the pads of your three middle fingers to do this. Apply light pressure, then medium pressure, then firm pressure. The light pressure will allow you to feel the tissue closest to the skin. The medium pressure will allow you to feel the tissue that is a little deeper. The firm pressure will allow you to feel the tissue close to the ribs. Continue the overlapping circles, moving downward over the breast until you feel your  ribs below your breast. Move one finger-width toward the center of the body. Continue to use the  inch (2 cm) overlapping circles to feel your breast as you move slowly up toward your collarbone. Continue the up and down exam using all three pressures until you reach your armpit.  Write Down What You Find  Write down what is normal for each breast and any changes that you find. Keep a written record with breast changes or normal findings for each breast. By writing this information down, you do not need to depend only on memory for size, tenderness, or location. Write down where you are in your menstrual cycle, if you are still menstruating. If you are having trouble noticing differences in your breasts, do not get discouraged. With time you will become more familiar with the variations in your breasts and more comfortable with the exam. How often should I examine my breasts? Examine your breasts every month. If you are breastfeeding, the best time to examine your breasts is after a feeding or after using a breast pump. If you menstruate, the best time to examine your breasts is 5-7 days after your period is over. During your period, your breasts are lumpier, and it may be more   difficult to notice changes. When should I see my health care provider? See your health care provider if you notice: A change in shape or size of your breasts or nipples. A change in the skin of your breast or nipples, such as a reddened or scaly area. Unusual discharge from your nipples. A lump or thick area that was not there before. Pain in your breasts. Anything that concerns you. Managing Anxiety, Adult After being diagnosed with anxiety, you may be relieved to know why you have felt or behaved a certain way. You may also feel overwhelmed about the treatment ahead and what it will mean for your life. With care and support, you can manage this condition. How to manage lifestyle changes Managing stress and  anxiety Stress is your body's reaction to life changes and events, both good and bad. Most stress will last just a few hours, but stress can be ongoing and can lead to more than just stress. Although stress can play a major role in anxiety, it is not the same as anxiety. Stress is usually caused by something external, such as a deadline, test, or competition. Stress normally passes after the triggering event has ended.  Anxiety is caused by something internal, such as imagining a terrible outcome or worrying that something will go wrong that will devastate you. Anxiety often does not go away even after the triggering event is over, and it can become long-term (chronic) worry. It is important to understand the differences between stress and anxiety and to manage your stress effectively so that it does not lead to an anxious response. Talk with your health care provider or a counselor to learn more about reducing anxiety and stress. He or she may suggest tension reduction techniques, such as: Music therapy. Spend time creating or listening to music that you enjoy and that inspires you. Mindfulness-based meditation. Practice being aware of your normal breaths while not trying to control your breathing. It can be done while sitting or walking. Centering prayer. This involves focusing on a word, phrase, or sacred image that means something to you and brings you peace. Deep breathing. To do this, expand your stomach and inhale slowly through your nose. Hold your breath for 3-5 seconds. Then exhale slowly, letting your stomach muscles relax. Self-talk. Learn to notice and identify thought patterns that lead to anxiety reactions and change those patterns to thoughts that feel peaceful. Muscle relaxation. Taking time to tense muscles and then relax them. Choose a tension reduction technique that fits your lifestyle and personality. These techniques take time and practice. Set aside 5-15 minutes a day to do them.  Therapists can offer counseling and training in these techniques. The training to help with anxiety may be covered by some insurance plans. Other things you can do to manage stress and anxiety include: Keeping a stress diary. This can help you learn what triggers your reaction and then learn ways to manage your response. Thinking about how you react to certain situations. You may not be able to control everything, but you can control your response. Making time for activities that help you relax and not feeling guilty about spending your time in this way. Doing visual imagery. This involves imagining or creating mental pictures to help you relax. Practicing yoga. Through yoga poses, you can lower tension and promote relaxation.  Medicines Medicines can help ease symptoms. Medicines for anxiety include: Antidepressant medicines. These are usually prescribed for long-term daily control. Anti-anxiety medicines. These may be added in  severe cases, especially when panic attacks occur. Medicines will be prescribed by a health care provider. When used together, medicines, psychotherapy, and tension reduction techniques may be the most effective treatment. Relationships Relationships can play a big part in helping you recover. Try to spend more time connecting with trusted friends and family members. Consider going to couples counseling if you have a partner, taking family education classes, or going to family therapy. Therapy can help you and others better understand your condition. How to recognize changes in your anxiety Everyone responds differently to treatment for anxiety. Recovery from anxiety happens when symptoms decrease and stop interfering with your daily activities at home or work. This may mean that you will start to: Have better concentration and focus. Worry will interfere less in your daily thinking. Sleep better. Be less irritable. Have more energy. Have improved memory. It is also  important to recognize when your condition is getting worse. Contact your health care provider if your symptoms interfere with home or work and you feel like your condition is not improving. Follow these instructions at home: Activity Exercise. Adults should do the following: Exercise for at least 150 minutes each week. The exercise should increase your heart rate and make you sweat (moderate-intensity exercise). Strengthening exercises at least twice a week. Get the right amount and quality of sleep. Most adults need 7-9 hours of sleep each night. Lifestyle  Eat a healthy diet that includes plenty of vegetables, fruits, whole grains, low-fat dairy products, and lean protein. Do not eat a lot of foods that are high in fats, added sugars, or salt (sodium). Make choices that simplify your life. Do not use any products that contain nicotine or tobacco. These products include cigarettes, chewing tobacco, and vaping devices, such as e-cigarettes. If you need help quitting, ask your health care provider. Avoid caffeine, alcohol, and certain over-the-counter cold medicines. These may make you feel worse. Ask your pharmacist which medicines to avoid. General instructions Take over-the-counter and prescription medicines only as told by your health care provider. Keep all follow-up visits. This is important. Where to find support You can get help and support from these sources: Self-help groups. Online and [employer]. A trusted spiritual leader. Couples counseling. Family education classes. Family therapy. Where to find more information You may find that joining a support group helps you deal with your anxiety. The following sources can help you locate counselors or support groups near you: Mental Health America: www.mentalhealthamerica.net Anxiety and Depression Association of [place] (ADAA): ProgramCam.de [employer] on Mental Illness (NAMI): www.nami.org Contact a health care  provider if: You have a hard time staying focused or finishing daily tasks. You spend many hours a day feeling worried about everyday life. You become exhausted by worry. You start to have headaches or frequently feel tense. You develop chronic nausea or diarrhea. Get help right away if: You have a racing heart and shortness of breath. You have thoughts of hurting yourself or others. If you ever feel like you may hurt yourself or others, or have thoughts about taking your own life, get help right away. Go to your nearest emergency department or: Call your local emergency services (911 in the [place] Call a suicide crisis helpline, such as the National Suicide Prevention Lifeline at (250)456-1353 or 988 in the [place] This is open 24 hours a day in the [place] Text the Crisis Text Line at 336-293-3161 (in the [place] Summary Taking steps to learn and use tension reduction techniques can help calm  you and help prevent triggering an anxiety reaction. When used together, medicines, psychotherapy, and tension reduction techniques may be the most effective treatment. Family, friends, and partners can play a big part in supporting you. This information is not intended to replace advice given to you by your health care provider. Make sure you discuss any questions you have with your health care provider. Document Revised: 12/08/2020 Document Reviewed: 09/05/2020 Elsevier Patient Education  2022 Else[employer] Inc. About Constipation  Constipation Overview Constipation is the most common gastrointestinal complaint -- about 4 million Americans experience constipation and make 2.5 million physician visits a year to get help for the problem.  Constipation can occur when the colon absorbs too much water, the colon's muscle contraction is slow or sluggish, and/or there is delayed transit time through the colon.  The result is stool that is hard and dry.  Indicators of constipation include straining during bowel movements  greater than 25% of the time, having fewer than three bowel movements per week, and/or the feeling of incomplete evacuation.  There are established guidelines ([place] II ) for defining constipation. A person needs to have two or more of the following symptoms for at least 12 weeks (not necessarily consecutive) in the preceding 12 months: Straining in  greater than 25% of bowel movements Lumpy or hard stools in greater than 25% of bowel movements Sensation of incomplete emptying in greater than 25% of bowel movements Sensation of anorectal obstruction/blockade in greater than 25% of bowel movements Manual maneuvers to help empty greater than 25% of bowel movements (e.g., digital evacuation, support of the pelvic floor)  Less than  3 bowel movements/week Loose stools are not present, and criteria for irritable bowel syndrome are insufficient  Common Causes of Constipation Lack of fiber in your diet Lack of physical activity Medications, including iron and calcium supplements  Dairy intake Dehydration Abuse of laxatives Travel Irritable Bowel Syndrome Pregnancy Luteal phase of menstruation (after ovulation and before menses) Colorectal problems Intestinal Dysfunction  Treating Constipation  There are several ways of treating constipation, including changes to diet and exercise, use of laxatives, adjustments to the pelvic floor, and scheduled toileting.  These treatments include: increasing fiber and fluids in the diet  increasing physical activity learning muscle coordination  learning proper toileting techniques and toileting modifications  designing and sticking  to a toileting schedule     2007, [employer] Doc.22

## 2021-04-15 LAB — RPR: RPR Ser Ql: NONREACTIVE

## 2021-04-15 LAB — CBC
HCT: 39.2 % (ref 35.0–45.0)
Hemoglobin: 12.9 g/dL (ref 11.7–15.5)
MCH: 29.7 pg (ref 27.0–33.0)
MCHC: 32.9 g/dL (ref 32.0–36.0)
MCV: 90.1 fL (ref 80.0–100.0)
MPV: 10.3 fL (ref 7.5–12.5)
Platelets: 265 10*3/uL (ref 140–400)
RBC: 4.35 10*6/uL (ref 3.80–5.10)
RDW: 12 % (ref 11.0–15.0)
WBC: 5.7 10*3/uL (ref 3.8–10.8)

## 2021-04-15 LAB — COMPREHENSIVE METABOLIC PANEL
AG Ratio: 1.4 (calc) (ref 1.0–2.5)
ALT: 8 U/L (ref 6–29)
AST: 14 U/L (ref 10–30)
Albumin: 4.1 g/dL (ref 3.6–5.1)
Alkaline phosphatase (APISO): 40 U/L (ref 31–125)
BUN/Creatinine Ratio: 8 (calc) (ref 6–22)
BUN: 6 mg/dL — ABNORMAL LOW (ref 7–25)
CO2: 29 mmol/L (ref 20–32)
Calcium: 9.2 mg/dL (ref 8.6–10.2)
Chloride: 106 mmol/L (ref 98–110)
Creat: 0.71 mg/dL (ref 0.50–0.97)
Globulin: 3 g/dL (calc) (ref 1.9–3.7)
Glucose, Bld: 87 mg/dL (ref 65–99)
Potassium: 4.8 mmol/L (ref 3.5–5.3)
Sodium: 142 mmol/L (ref 135–146)
Total Bilirubin: 0.6 mg/dL (ref 0.2–1.2)
Total Protein: 7.1 g/dL (ref 6.1–8.1)

## 2021-04-15 LAB — LIPID PANEL
Cholesterol: 202 mg/dL — ABNORMAL HIGH (ref <200)
HDL: 64 mg/dL (ref >=50)
LDL Cholesterol (Calc): 124 mg/dL (calc) — ABNORMAL HIGH
Non-HDL Cholesterol (Calc): 138 mg/dL (calc) — ABNORMAL HIGH (ref <130)
Total CHOL/HDL Ratio: 3.2 (calc) (ref <5.0)
Triglycerides: 45 mg/dL (ref <150)

## 2021-04-15 LAB — HEMOGLOBIN A1C
Hgb A1c MFr Bld: 5 % of total Hgb (ref <5.7)
Mean Plasma Glucose: 97 mg/dL
eAG (mmol/L): 5.4 mmol/L

## 2021-04-15 LAB — HIV ANTIBODY (ROUTINE TESTING W REFLEX): HIV 1&2 Ab, 4th Generation: NONREACTIVE

## 2021-04-15 LAB — TSH: TSH: 1.52 mIU/L

## 2021-04-15 LAB — HEPATITIS C ANTIBODY
Hepatitis C Ab: NONREACTIVE
SIGNAL TO CUT-OFF: 0.13 (ref <1.00)

## 2021-04-20 LAB — CYTOLOGY - PAP
Adequacy: ABSENT
Chlamydia: NEGATIVE
Comment: NEGATIVE
Comment: NEGATIVE
Comment: NEGATIVE
Comment: NORMAL
Diagnosis: REACTIVE
High risk HPV: NEGATIVE
Neisseria Gonorrhea: NEGATIVE
Trichomonas: NEGATIVE
